# Patient Record
Sex: Female | Born: 1960 | Race: White | Hispanic: No | Marital: Married | Smoking: Never smoker
Health system: Southern US, Community
[De-identification: ages and names within clinical notes are randomized; demographics above are authoritative.]

## PROBLEM LIST (undated history)

## (undated) DIAGNOSIS — M5136 Other intervertebral disc degeneration, lumbar region: Secondary | ICD-10-CM

## (undated) DIAGNOSIS — M51369 Other intervertebral disc degeneration, lumbar region without mention of lumbar back pain or lower extremity pain: Secondary | ICD-10-CM

## (undated) DIAGNOSIS — G709 Myoneural disorder, unspecified: Secondary | ICD-10-CM

## (undated) DIAGNOSIS — M1990 Unspecified osteoarthritis, unspecified site: Secondary | ICD-10-CM

## (undated) HISTORY — DX: Unspecified osteoarthritis, unspecified site: M19.90

## (undated) HISTORY — DX: Myoneural disorder, unspecified: G70.9

## (undated) HISTORY — DX: Other intervertebral disc degeneration, lumbar region: M51.36

## (undated) HISTORY — DX: Other intervertebral disc degeneration, lumbar region without mention of lumbar back pain or lower extremity pain: M51.369

---

## 1993-10-22 HISTORY — PX: OTHER SURGICAL HISTORY: SHX169

## 2003-10-23 HISTORY — PX: INGUINAL HERNIA REPAIR: SHX194

## 2005-10-22 HISTORY — PX: HERNIA REPAIR: SHX51

## 2008-05-27 ENCOUNTER — Encounter: Admission: RE | Admit: 2008-05-27 | Discharge: 2008-05-27 | Payer: Self-pay | Admitting: Obstetrics and Gynecology

## 2009-07-04 ENCOUNTER — Encounter: Admission: RE | Admit: 2009-07-04 | Discharge: 2009-07-04 | Payer: Self-pay | Admitting: Obstetrics and Gynecology

## 2010-08-28 ENCOUNTER — Encounter: Admission: RE | Admit: 2010-08-28 | Discharge: 2010-08-28 | Payer: Self-pay | Admitting: Obstetrics and Gynecology

## 2010-11-13 ENCOUNTER — Encounter: Payer: Self-pay | Admitting: *Deleted

## 2010-11-13 ENCOUNTER — Encounter (HOSPITAL_COMMUNITY): Payer: Self-pay | Admitting: Obstetrics and Gynecology

## 2011-03-23 DIAGNOSIS — G709 Myoneural disorder, unspecified: Secondary | ICD-10-CM

## 2011-03-23 HISTORY — DX: Myoneural disorder, unspecified: G70.9

## 2011-06-21 ENCOUNTER — Encounter: Payer: Self-pay | Admitting: Internal Medicine

## 2011-07-25 ENCOUNTER — Other Ambulatory Visit: Payer: Self-pay | Admitting: Internal Medicine

## 2011-07-31 ENCOUNTER — Ambulatory Visit (AMBULATORY_SURGERY_CENTER): Payer: BC Managed Care – PPO | Admitting: *Deleted

## 2011-07-31 VITALS — Ht 64.0 in | Wt 126.0 lb

## 2011-07-31 DIAGNOSIS — R1032 Left lower quadrant pain: Secondary | ICD-10-CM

## 2011-07-31 DIAGNOSIS — Z1211 Encounter for screening for malignant neoplasm of colon: Secondary | ICD-10-CM

## 2011-07-31 MED ORDER — PEG-KCL-NACL-NASULF-NA ASC-C 100 G PO SOLR: ORAL | Status: DC

## 2011-08-09 ENCOUNTER — Telehealth: Payer: Self-pay | Admitting: Internal Medicine

## 2011-08-09 NOTE — Telephone Encounter (Signed)
I have reviewed the colon report from 08/2006- no polyps. Considering the F hx of colon polyps, she would be due a recall in 7010 years, so 08/2013 would be the earliest. If she wants it, however now  We can do it.

## 2011-08-09 NOTE — Telephone Encounter (Signed)
Left message on pt's phone and explained that her colon report from [facility] came and I put it on Dr. Regino Schultze desk.  I explained Dr. Juanda Chance was the hospital dr this week.  Dr. Juanda Chance, Ms Koskela had a colon in [place] and we have the report.  I put it on your desk to review.  She did not have any polyps.  She has family hx of adenomatous polyps first degree relative/Mother.  She c/o LLQ abd pain and she thought she needed a colon this year due to the family hx of polyps.  Please advise.  She is scheduled for 08/14/11.   Thank you.  Wyona Almas

## 2011-08-09 NOTE — Telephone Encounter (Signed)
Attempted to reach pt.  Message says phone has been disconnected. Penny Horton

## 2011-08-10 NOTE — Telephone Encounter (Signed)
Pt.  Notified that her colon would not be due until 2014 - 2017. She chose to wait.  Colon Cx'd.   Penny Horton

## 2011-08-13 ENCOUNTER — Telehealth: Payer: Self-pay | Admitting: *Deleted

## 2011-08-13 NOTE — Telephone Encounter (Signed)
Dr Juanda Chance has reviewed patient's colonoscopy report from [facility] from 08/27/2006 and states that she does not need recall colonoscopy until 08/2013 (she only has family history of colon adenoma). I have left a voicemail for patient to call back to advise her of this.

## 2011-08-13 NOTE — Telephone Encounter (Signed)
Patient has been advised that her colonoscopy is not due until 2014. She verbalizes understanding.

## 2011-08-14 ENCOUNTER — Other Ambulatory Visit: Payer: Self-pay | Admitting: Internal Medicine

## 2011-09-28 ENCOUNTER — Other Ambulatory Visit: Payer: Self-pay | Admitting: Obstetrics and Gynecology

## 2011-09-28 DIAGNOSIS — Z1231 Encounter for screening mammogram for malignant neoplasm of breast: Secondary | ICD-10-CM

## 2011-10-09 ENCOUNTER — Ambulatory Visit
Admission: RE | Admit: 2011-10-09 | Discharge: 2011-10-09 | Disposition: A | Discharge status: Home / Self Care | Payer: BC Managed Care – PPO | Source: Ambulatory Visit | Attending: Obstetrics and Gynecology | Admitting: Obstetrics and Gynecology

## 2011-10-09 DIAGNOSIS — Z1231 Encounter for screening mammogram for malignant neoplasm of breast: Secondary | ICD-10-CM

## 2012-07-22 ENCOUNTER — Other Ambulatory Visit: Payer: Self-pay | Admitting: Obstetrics and Gynecology

## 2012-07-22 DIAGNOSIS — Z1231 Encounter for screening mammogram for malignant neoplasm of breast: Secondary | ICD-10-CM

## 2012-10-09 ENCOUNTER — Ambulatory Visit
Admission: RE | Admit: 2012-10-09 | Discharge: 2012-10-09 | Disposition: A | Discharge status: Home / Self Care | Payer: BC Managed Care – PPO | Source: Ambulatory Visit | Attending: Obstetrics and Gynecology | Admitting: Obstetrics and Gynecology

## 2012-10-09 ENCOUNTER — Ambulatory Visit: Payer: BC Managed Care – PPO

## 2012-10-09 DIAGNOSIS — Z1231 Encounter for screening mammogram for malignant neoplasm of breast: Secondary | ICD-10-CM

## 2013-06-08 ENCOUNTER — Encounter: Payer: Self-pay | Admitting: Internal Medicine

## 2013-07-01 ENCOUNTER — Other Ambulatory Visit: Payer: Self-pay

## 2013-07-01 DIAGNOSIS — Z1231 Encounter for screening mammogram for malignant neoplasm of breast: Secondary | ICD-10-CM

## 2013-10-12 ENCOUNTER — Ambulatory Visit
Admission: RE | Admit: 2013-10-12 | Discharge: 2013-10-12 | Disposition: A | Discharge status: Home / Self Care | Payer: BC Managed Care – PPO | Source: Ambulatory Visit

## 2013-10-12 DIAGNOSIS — Z1231 Encounter for screening mammogram for malignant neoplasm of breast: Secondary | ICD-10-CM

## 2014-03-20 ENCOUNTER — Encounter: Payer: Self-pay | Admitting: Internal Medicine

## 2014-08-30 ENCOUNTER — Other Ambulatory Visit: Payer: Self-pay

## 2014-08-30 DIAGNOSIS — Z1231 Encounter for screening mammogram for malignant neoplasm of breast: Secondary | ICD-10-CM

## 2014-10-22 HISTORY — PX: COLONOSCOPY: SHX174

## 2014-10-27 ENCOUNTER — Ambulatory Visit: Admission: RE | Admit: 2014-10-27 | Payer: Self-pay | Source: Ambulatory Visit

## 2014-10-27 ENCOUNTER — Other Ambulatory Visit: Payer: Self-pay

## 2014-10-27 ENCOUNTER — Ambulatory Visit
Admission: RE | Admit: 2014-10-27 | Discharge: 2014-10-27 | Disposition: A | Discharge status: Home / Self Care | Payer: BLUE CROSS/BLUE SHIELD | Source: Ambulatory Visit

## 2014-10-27 ENCOUNTER — Ambulatory Visit: Payer: BC Managed Care – PPO

## 2014-10-27 DIAGNOSIS — Z1231 Encounter for screening mammogram for malignant neoplasm of breast: Secondary | ICD-10-CM

## 2015-10-10 ENCOUNTER — Other Ambulatory Visit: Payer: Self-pay

## 2015-10-10 DIAGNOSIS — Z1231 Encounter for screening mammogram for malignant neoplasm of breast: Secondary | ICD-10-CM

## 2015-11-08 ENCOUNTER — Ambulatory Visit
Admission: RE | Admit: 2015-11-08 | Discharge: 2015-11-08 | Disposition: A | Discharge status: Home / Self Care | Payer: BLUE CROSS/BLUE SHIELD | Source: Ambulatory Visit

## 2015-11-08 DIAGNOSIS — Z1231 Encounter for screening mammogram for malignant neoplasm of breast: Secondary | ICD-10-CM

## 2016-01-24 DIAGNOSIS — M545 Low back pain: Secondary | ICD-10-CM | POA: Diagnosis not present

## 2016-01-24 DIAGNOSIS — Z6821 Body mass index (BMI) 21.0-21.9, adult: Secondary | ICD-10-CM | POA: Diagnosis not present

## 2016-02-23 DIAGNOSIS — K529 Noninfective gastroenteritis and colitis, unspecified: Secondary | ICD-10-CM | POA: Diagnosis not present

## 2016-02-23 DIAGNOSIS — Z6821 Body mass index (BMI) 21.0-21.9, adult: Secondary | ICD-10-CM | POA: Diagnosis not present

## 2016-04-29 DIAGNOSIS — H15101 Unspecified episcleritis, right eye: Secondary | ICD-10-CM | POA: Diagnosis not present

## 2016-05-17 DIAGNOSIS — R5383 Other fatigue: Secondary | ICD-10-CM | POA: Diagnosis not present

## 2016-05-17 DIAGNOSIS — E049 Nontoxic goiter, unspecified: Secondary | ICD-10-CM | POA: Diagnosis not present

## 2016-05-17 DIAGNOSIS — M545 Low back pain: Secondary | ICD-10-CM | POA: Diagnosis not present

## 2016-05-17 DIAGNOSIS — M791 Myalgia: Secondary | ICD-10-CM | POA: Diagnosis not present

## 2016-05-17 DIAGNOSIS — M5137 Other intervertebral disc degeneration, lumbosacral region: Secondary | ICD-10-CM | POA: Diagnosis not present

## 2016-05-17 DIAGNOSIS — D6489 Other specified anemias: Secondary | ICD-10-CM | POA: Diagnosis not present

## 2016-05-18 DIAGNOSIS — M542 Cervicalgia: Secondary | ICD-10-CM | POA: Diagnosis not present

## 2016-05-22 DIAGNOSIS — E042 Nontoxic multinodular goiter: Secondary | ICD-10-CM | POA: Diagnosis not present

## 2016-05-23 DIAGNOSIS — M791 Myalgia: Secondary | ICD-10-CM | POA: Diagnosis not present

## 2016-05-28 DIAGNOSIS — E041 Nontoxic single thyroid nodule: Secondary | ICD-10-CM | POA: Diagnosis not present

## 2016-06-07 DIAGNOSIS — M5441 Lumbago with sciatica, right side: Secondary | ICD-10-CM | POA: Diagnosis not present

## 2016-06-19 DIAGNOSIS — L821 Other seborrheic keratosis: Secondary | ICD-10-CM | POA: Diagnosis not present

## 2016-06-28 DIAGNOSIS — M791 Myalgia: Secondary | ICD-10-CM | POA: Diagnosis not present

## 2016-07-26 DIAGNOSIS — K648 Other hemorrhoids: Secondary | ICD-10-CM | POA: Diagnosis not present

## 2016-07-26 DIAGNOSIS — R194 Change in bowel habit: Secondary | ICD-10-CM | POA: Diagnosis not present

## 2016-07-27 DIAGNOSIS — M4726 Other spondylosis with radiculopathy, lumbar region: Secondary | ICD-10-CM | POA: Diagnosis not present

## 2016-07-27 DIAGNOSIS — M5416 Radiculopathy, lumbar region: Secondary | ICD-10-CM | POA: Diagnosis not present

## 2016-07-27 DIAGNOSIS — Z0000 Encounter for general adult medical examination without abnormal findings: Secondary | ICD-10-CM | POA: Diagnosis not present

## 2016-07-27 DIAGNOSIS — M5136 Other intervertebral disc degeneration, lumbar region: Secondary | ICD-10-CM | POA: Diagnosis not present

## 2016-07-27 DIAGNOSIS — M48061 Spinal stenosis, lumbar region without neurogenic claudication: Secondary | ICD-10-CM | POA: Diagnosis not present

## 2016-07-31 DIAGNOSIS — H409 Unspecified glaucoma: Secondary | ICD-10-CM | POA: Diagnosis not present

## 2016-07-31 DIAGNOSIS — Z1389 Encounter for screening for other disorder: Secondary | ICD-10-CM | POA: Diagnosis not present

## 2016-07-31 DIAGNOSIS — Z0000 Encounter for general adult medical examination without abnormal findings: Secondary | ICD-10-CM | POA: Diagnosis not present

## 2016-07-31 DIAGNOSIS — M791 Myalgia: Secondary | ICD-10-CM | POA: Diagnosis not present

## 2016-07-31 DIAGNOSIS — M5136 Other intervertebral disc degeneration, lumbar region: Secondary | ICD-10-CM | POA: Diagnosis not present

## 2016-07-31 DIAGNOSIS — E049 Nontoxic goiter, unspecified: Secondary | ICD-10-CM | POA: Diagnosis not present

## 2016-08-02 DIAGNOSIS — Z1212 Encounter for screening for malignant neoplasm of rectum: Secondary | ICD-10-CM | POA: Diagnosis not present

## 2016-08-02 LAB — IFOBT (OCCULT BLOOD): IMMUNOLOGICAL FECAL OCCULT BLOOD TEST: NEGATIVE

## 2016-10-11 ENCOUNTER — Other Ambulatory Visit: Payer: Self-pay | Admitting: Obstetrics and Gynecology

## 2016-10-11 DIAGNOSIS — Z1231 Encounter for screening mammogram for malignant neoplasm of breast: Secondary | ICD-10-CM

## 2016-11-15 ENCOUNTER — Ambulatory Visit
Admission: RE | Admit: 2016-11-15 | Discharge: 2016-11-15 | Disposition: A | Discharge status: Home / Self Care | Payer: BLUE CROSS/BLUE SHIELD | Source: Ambulatory Visit | Attending: Obstetrics and Gynecology | Admitting: Obstetrics and Gynecology

## 2016-11-15 DIAGNOSIS — Z1231 Encounter for screening mammogram for malignant neoplasm of breast: Secondary | ICD-10-CM

## 2016-11-20 DIAGNOSIS — M5410 Radiculopathy, site unspecified: Secondary | ICD-10-CM | POA: Diagnosis not present

## 2016-11-20 DIAGNOSIS — M79605 Pain in left leg: Secondary | ICD-10-CM | POA: Diagnosis not present

## 2016-11-20 DIAGNOSIS — S76312A Strain of muscle, fascia and tendon of the posterior muscle group at thigh level, left thigh, initial encounter: Secondary | ICD-10-CM | POA: Diagnosis not present

## 2016-12-04 DIAGNOSIS — M79605 Pain in left leg: Secondary | ICD-10-CM | POA: Diagnosis not present

## 2016-12-27 ENCOUNTER — Encounter: Payer: Self-pay | Admitting: Family Medicine

## 2016-12-27 DIAGNOSIS — M5136 Other intervertebral disc degeneration, lumbar region: Secondary | ICD-10-CM | POA: Diagnosis not present

## 2016-12-27 DIAGNOSIS — M5441 Lumbago with sciatica, right side: Secondary | ICD-10-CM | POA: Diagnosis not present

## 2017-01-14 DIAGNOSIS — Z6821 Body mass index (BMI) 21.0-21.9, adult: Secondary | ICD-10-CM | POA: Diagnosis not present

## 2017-01-14 DIAGNOSIS — Z01419 Encounter for gynecological examination (general) (routine) without abnormal findings: Secondary | ICD-10-CM | POA: Diagnosis not present

## 2017-04-04 DIAGNOSIS — M5416 Radiculopathy, lumbar region: Secondary | ICD-10-CM | POA: Diagnosis not present

## 2017-04-04 DIAGNOSIS — M25552 Pain in left hip: Secondary | ICD-10-CM | POA: Diagnosis not present

## 2017-06-28 ENCOUNTER — Telehealth: Payer: Self-pay

## 2017-06-28 NOTE — Telephone Encounter (Signed)
Patient is scheduled to see Dr. Sarajane Jews next week, however her insurance co is saying they cannot find [place] in their network. There is a copy of her current insurance card in her media tab. She has [employer] [place].  How do we find out if we are in-network with her plan? Thanks!

## 2017-07-01 NOTE — Telephone Encounter (Signed)
Can you check for this pt on her insurance?  She says [employer] has Penny Horton listed , but no providers.

## 2017-07-02 ENCOUNTER — Ambulatory Visit (INDEPENDENT_AMBULATORY_CARE_PROVIDER_SITE_OTHER): Payer: BLUE CROSS/BLUE SHIELD | Admitting: Family Medicine

## 2017-07-02 ENCOUNTER — Encounter: Payer: Self-pay | Admitting: Family Medicine

## 2017-07-02 VITALS — BP 96/79 | HR 52 | Temp 97.8°F | Ht 64.0 in | Wt 132.0 lb

## 2017-07-02 DIAGNOSIS — M791 Myalgia, unspecified site: Secondary | ICD-10-CM

## 2017-07-02 DIAGNOSIS — H409 Unspecified glaucoma: Secondary | ICD-10-CM | POA: Insufficient documentation

## 2017-07-02 DIAGNOSIS — M150 Primary generalized (osteo)arthritis: Secondary | ICD-10-CM | POA: Diagnosis not present

## 2017-07-02 DIAGNOSIS — H4010X0 Unspecified open-angle glaucoma, stage unspecified: Secondary | ICD-10-CM

## 2017-07-02 DIAGNOSIS — M1990 Unspecified osteoarthritis, unspecified site: Secondary | ICD-10-CM | POA: Insufficient documentation

## 2017-07-02 DIAGNOSIS — M159 Polyosteoarthritis, unspecified: Secondary | ICD-10-CM

## 2017-07-02 DIAGNOSIS — M8949 Other hypertrophic osteoarthropathy, multiple sites: Secondary | ICD-10-CM

## 2017-07-02 NOTE — Progress Notes (Signed)
   Subjective:    Patient ID: Penny Horton, female    DOB: July 13, 1961, 56 y.o.   MRN: [id number]  HPI 56 yr old female to establish with [place] after transferring from Dr. Domenick Gong. She feels well except for arthritis pain in numerous joints, especially the hips. She describes developing diffuse muscle aches, especially in both thighs, last year. She initially had an elevated CK up to the 900s, and then this decreased to the 500s on follow up. For some reason this has not been checked in over a year. She still has some mild aches in the thighs but never takes anything for this. She has osteoarthritis in numerous joints and degenerative discs in the spine. She also notes that she had a mildly elevated ferritin level a year ago, and she is concerned about having hemochromatosis because several family members have been diagnosed with this. She gets yearly pelvic exams and mammograms. She has glaucoma which is well controlled. 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    Review of Systems  Constitutional: Negative.   Respiratory: Negative.   Cardiovascular: Negative.   Gastrointestinal: Negative.   Genitourinary: Negative.   Musculoskeletal: Positive for arthralgias, back pain and myalgias.  Neurological: Negative.        Objective:   Physical Exam  Constitutional: She is oriented to person, place, and time. She appears well-developed and well-nourished.  Neck: No thyromegaly present.  Cardiovascular: Normal rate, regular rhythm, normal heart sounds and intact distal pulses.   Pulmonary/Chest: Effort normal and breath sounds normal. No respiratory distress. She has no wheezes. She has no rales.  Abdominal: Soft. Bowel sounds are normal. She exhibits no distension and no mass. There is no tenderness. There is no rebound and no guarding.  No HSM   Lymphadenopathy:    She has no cervical adenopathy.  Neurological: She is alert and oriented to person, place,  and time.          Assessment & Plan:  Introductory visit with this patient who has osteoarthritis and what sounds like an inflammatory muscle condition like polymyositis. We will check a CK level today, and we will check markers like an ESR and CRP. We will also screen for basic labs including a ferritin and a serum iron to rule out hemochromatosis.  Alysia Penna, MD

## 2017-07-02 NOTE — Patient Instructions (Signed)
WE NOW OFFER   [facility] FAST TRACK!!!  SAME DAY Appointments for ACUTE CARE  Such as: Sprains, Injuries, cuts, abrasions, rashes, muscle pain, joint pain, back pain Colds, flu, sore throats, headache, allergies, cough, fever  Ear pain, sinus and eye infections Abdominal pain, nausea, vomiting, diarrhea, upset stomach Animal/insect bites  3 Easy Ways to Schedule: Walk-In Scheduling Call in scheduling Mychart Sign-up: [email or web address]         

## 2017-07-03 LAB — BASIC METABOLIC PANEL
BUN: 16 mg/dL (ref 6–23)
CALCIUM: 9.5 mg/dL (ref 8.4–10.5)
CHLORIDE: 103 meq/L (ref 96–112)
CO2: 30 meq/L (ref 19–32)
Creatinine, Ser: 0.85 mg/dL (ref 0.40–1.20)
GFR: 73.51 mL/min (ref >60.00)
GLUCOSE: 84 mg/dL (ref 70–99)
POTASSIUM: 4 meq/L (ref 3.5–5.1)
SODIUM: 142 meq/L (ref 135–145)

## 2017-07-03 LAB — CBC WITH DIFFERENTIAL/PLATELET
BASOS PCT: 0.4 % (ref 0.0–3.0)
Basophils Absolute: 0 10*3/uL (ref 0.0–0.1)
EOS ABS: 0.1 10*3/uL (ref 0.0–0.7)
Eosinophils Relative: 1.4 % (ref 0.0–5.0)
HEMATOCRIT: 40.1 % (ref 36.0–46.0)
Hemoglobin: 13.4 g/dL (ref 12.0–15.0)
LYMPHS ABS: 1.1 10*3/uL (ref 0.7–4.0)
LYMPHS PCT: 21.1 % (ref 12.0–46.0)
MCHC: 33.5 g/dL (ref 30.0–36.0)
MCV: 96.4 fl (ref 78.0–100.0)
Monocytes Absolute: 0.5 10*3/uL (ref 0.1–1.0)
Monocytes Relative: 9.2 % (ref 3.0–12.0)
NEUTROS ABS: 3.5 10*3/uL (ref 1.4–7.7)
NEUTROS PCT: 67.9 % (ref 43.0–77.0)
PLATELETS: 220 10*3/uL (ref 150.0–400.0)
RBC: 4.15 Mil/uL (ref 3.87–5.11)
RDW: 12.3 % (ref 11.5–15.5)
WBC: 5.1 10*3/uL (ref 4.0–10.5)

## 2017-07-03 LAB — IRON: Iron: 99 ug/dL (ref 42–145)

## 2017-07-03 LAB — HEPATIC FUNCTION PANEL
ALBUMIN: 4.4 g/dL (ref 3.5–5.2)
ALK PHOS: 91 U/L (ref 39–117)
ALT: 14 U/L (ref 0–35)
AST: 20 U/L (ref 0–37)
BILIRUBIN DIRECT: 0.1 mg/dL (ref 0.0–0.3)
TOTAL PROTEIN: 6.4 g/dL (ref 6.0–8.3)
Total Bilirubin: 0.3 mg/dL (ref 0.2–1.2)

## 2017-07-03 LAB — FERRITIN: Ferritin: 44.7 ng/mL (ref 10.0–291.0)

## 2017-07-03 LAB — C-REACTIVE PROTEIN: CRP: <0 mg/dL — ABNORMAL LOW (ref 0.5–20.0)

## 2017-07-03 LAB — TSH: TSH: 1.22 u[IU]/mL (ref 0.35–4.50)

## 2017-07-03 LAB — CK: Total CK: 129 U/L (ref 7–177)

## 2017-07-03 LAB — SEDIMENTATION RATE: SED RATE: 1 mm/h (ref 0–30)

## 2017-09-23 DIAGNOSIS — H401221 Low-tension glaucoma, left eye, mild stage: Secondary | ICD-10-CM | POA: Diagnosis not present

## 2017-09-23 DIAGNOSIS — H0014 Chalazion left upper eyelid: Secondary | ICD-10-CM | POA: Diagnosis not present

## 2017-09-23 DIAGNOSIS — H25032 Anterior subcapsular polar age-related cataract, left eye: Secondary | ICD-10-CM | POA: Diagnosis not present

## 2017-09-26 DIAGNOSIS — H00021 Hordeolum internum right upper eyelid: Secondary | ICD-10-CM | POA: Diagnosis not present

## 2017-09-26 DIAGNOSIS — H0015 Chalazion left lower eyelid: Secondary | ICD-10-CM | POA: Diagnosis not present

## 2017-09-26 DIAGNOSIS — H02886 Meibomian gland dysfunction of left eye, unspecified eyelid: Secondary | ICD-10-CM | POA: Diagnosis not present

## 2017-09-26 DIAGNOSIS — H00011 Hordeolum externum right upper eyelid: Secondary | ICD-10-CM | POA: Diagnosis not present

## 2017-09-26 DIAGNOSIS — H00014 Hordeolum externum left upper eyelid: Secondary | ICD-10-CM | POA: Diagnosis not present

## 2017-09-26 DIAGNOSIS — R112 Nausea with vomiting, unspecified: Secondary | ICD-10-CM | POA: Diagnosis not present

## 2017-09-26 DIAGNOSIS — H02883 Meibomian gland dysfunction of right eye, unspecified eyelid: Secondary | ICD-10-CM | POA: Diagnosis not present

## 2017-09-26 DIAGNOSIS — H0011 Chalazion right upper eyelid: Secondary | ICD-10-CM | POA: Diagnosis not present

## 2017-09-26 DIAGNOSIS — R51 Headache: Secondary | ICD-10-CM | POA: Diagnosis not present

## 2017-09-26 DIAGNOSIS — H02831 Dermatochalasis of right upper eyelid: Secondary | ICD-10-CM | POA: Diagnosis not present

## 2017-09-26 DIAGNOSIS — H00015 Hordeolum externum left lower eyelid: Secondary | ICD-10-CM | POA: Diagnosis not present

## 2017-09-26 DIAGNOSIS — H00024 Hordeolum internum left upper eyelid: Secondary | ICD-10-CM | POA: Diagnosis not present

## 2017-09-26 DIAGNOSIS — L03213 Periorbital cellulitis: Secondary | ICD-10-CM | POA: Diagnosis not present

## 2017-09-26 DIAGNOSIS — H25032 Anterior subcapsular polar age-related cataract, left eye: Secondary | ICD-10-CM | POA: Diagnosis not present

## 2017-09-26 DIAGNOSIS — H4089 Other specified glaucoma: Secondary | ICD-10-CM | POA: Diagnosis not present

## 2017-09-26 DIAGNOSIS — H02834 Dermatochalasis of left upper eyelid: Secondary | ICD-10-CM | POA: Diagnosis not present

## 2017-10-10 DIAGNOSIS — H00011 Hordeolum externum right upper eyelid: Secondary | ICD-10-CM | POA: Diagnosis not present

## 2017-10-10 DIAGNOSIS — H00022 Hordeolum internum right lower eyelid: Secondary | ICD-10-CM | POA: Diagnosis not present

## 2017-10-10 DIAGNOSIS — H00015 Hordeolum externum left lower eyelid: Secondary | ICD-10-CM | POA: Diagnosis not present

## 2017-10-10 DIAGNOSIS — H00014 Hordeolum externum left upper eyelid: Secondary | ICD-10-CM | POA: Diagnosis not present

## 2017-10-28 ENCOUNTER — Other Ambulatory Visit: Payer: Self-pay | Admitting: Obstetrics and Gynecology

## 2017-10-28 DIAGNOSIS — Z1231 Encounter for screening mammogram for malignant neoplasm of breast: Secondary | ICD-10-CM

## 2017-11-19 ENCOUNTER — Ambulatory Visit
Admission: RE | Admit: 2017-11-19 | Discharge: 2017-11-19 | Disposition: A | Discharge status: Home / Self Care | Payer: BLUE CROSS/BLUE SHIELD | Source: Ambulatory Visit | Attending: Obstetrics and Gynecology | Admitting: Obstetrics and Gynecology

## 2017-11-19 DIAGNOSIS — Z1231 Encounter for screening mammogram for malignant neoplasm of breast: Secondary | ICD-10-CM

## 2018-02-11 DIAGNOSIS — Z6822 Body mass index (BMI) 22.0-22.9, adult: Secondary | ICD-10-CM | POA: Diagnosis not present

## 2018-02-11 DIAGNOSIS — Z01419 Encounter for gynecological examination (general) (routine) without abnormal findings: Secondary | ICD-10-CM | POA: Diagnosis not present

## 2018-02-18 DIAGNOSIS — R222 Localized swelling, mass and lump, trunk: Secondary | ICD-10-CM | POA: Diagnosis not present

## 2018-03-04 ENCOUNTER — Encounter: Payer: Self-pay | Admitting: Family Medicine

## 2018-03-04 ENCOUNTER — Ambulatory Visit (INDEPENDENT_AMBULATORY_CARE_PROVIDER_SITE_OTHER): Payer: BLUE CROSS/BLUE SHIELD | Admitting: Family Medicine

## 2018-03-04 VITALS — BP 94/62 | HR 49 | Temp 98.4°F | Wt 131.4 lb

## 2018-03-04 DIAGNOSIS — M2550 Pain in unspecified joint: Secondary | ICD-10-CM | POA: Diagnosis not present

## 2018-03-04 NOTE — Progress Notes (Signed)
   Subjective:    Patient ID: Penny Horton, female    DOB: 12-24-1960, 57 y.o.   MRN: [id number]  HPI Here for intermittent stiffness and pain in the elbows and hands. She saw [place] last fall for similar joint pains and we drew some labs that were all normal. These pains come and go and they move from one area to another. She takes turmeric daily and she tries to avoid taking NSAIDs if she can.    Review of Systems  Constitutional: Negative.   Respiratory: Negative.   Cardiovascular: Negative.   Musculoskeletal: Positive for arthralgias. Negative for joint swelling.       Objective:   Physical Exam  Constitutional: She appears well-developed and well-nourished.  Cardiovascular: Normal rate, regular rhythm, normal heart sounds and intact distal pulses.  Pulmonary/Chest: Effort normal and breath sounds normal.  Musculoskeletal: Normal range of motion. She exhibits no edema or tenderness.          Assessment & Plan:  Myalgias and arthralgias of uncertain etiology. We will refer her to Rheumatology to evaluate.  Alysia Penna, MD

## 2018-07-14 ENCOUNTER — Encounter: Payer: Self-pay | Admitting: Family Medicine

## 2018-07-14 ENCOUNTER — Ambulatory Visit (INDEPENDENT_AMBULATORY_CARE_PROVIDER_SITE_OTHER): Payer: BLUE CROSS/BLUE SHIELD | Admitting: Family Medicine

## 2018-07-14 VITALS — BP 100/78 | HR 67 | Temp 98.3°F | Wt 128.0 lb

## 2018-07-14 DIAGNOSIS — M545 Low back pain, unspecified: Secondary | ICD-10-CM

## 2018-07-14 MED ORDER — METHYLPREDNISOLONE 4 MG PO TBPK: ORAL_TABLET | ORAL | 0 refills | Status: DC

## 2018-07-14 MED ORDER — METAXALONE 800 MG PO TABS: 800.0000 mg | ORAL_TABLET | Freq: Four times a day (QID) | ORAL | 2 refills | Status: DC | PRN

## 2018-07-14 NOTE — Progress Notes (Signed)
   Subjective:    Patient ID: Penny Horton, female    DOB: 1961-01-01, 57 y.o.   MRN: [id number]  HPI Here for 3 weeks of constant low back pain and stiffness. The pain does not radiate to the legs. No hx of recent trauma, but the pain started shortly after a trip to [place] which involved 2 long plane flights. She saw Dr. Ellene Route 7 years ago for low back pain and an MRI revealed a bulging disc at L5-S1. She was treated at that time with an epidural steroid injection and PT. She responded well. Then one year ago she had another flare of pain and she was given another ESI. This also worked well. Over the past few weeks she has used ice packs and ibuprofen with mixed results. She is still working every day.    Review of Systems  Constitutional: Negative.   Respiratory: Negative.   Cardiovascular: Negative.   Gastrointestinal: Negative.   Genitourinary: Negative.   Musculoskeletal: Positive for back pain.       Objective:   Physical Exam  Constitutional: She appears well-developed and well-nourished.  Cardiovascular: Normal rate, regular rhythm, normal heart sounds and intact distal pulses.  Pulmonary/Chest: Effort normal and breath sounds normal.  Musculoskeletal:  She is mildly tender in the lower back and there is some spasm present. ROM is full. SLR are negative.           Assessment & Plan:  Low back pain, possibly caused by a long plane flight. Try heat, a Medrol dose pack, and Skelaxin. Recheck prn. Alysia Penna, MD

## 2018-08-29 ENCOUNTER — Telehealth: Payer: Self-pay | Admitting: *Deleted

## 2018-08-29 NOTE — Telephone Encounter (Signed)
Dr Fry please advise. thanks 

## 2018-08-29 NOTE — Telephone Encounter (Signed)
Copied from [place] [phone]. Topic: General - Other >> Aug 29, 2018 12:16 PM Yvette Rack wrote: Reason for CRM: pt calling stating that she would drop off a report about her back for Dr Sarajane Jews can look at it after work   Will send to Dr Sarajane Jews nurse to keep an eye out for forms.

## 2018-09-05 ENCOUNTER — Ambulatory Visit (INDEPENDENT_AMBULATORY_CARE_PROVIDER_SITE_OTHER): Payer: BLUE CROSS/BLUE SHIELD | Admitting: Family Medicine

## 2018-09-05 ENCOUNTER — Encounter: Payer: Self-pay | Admitting: Family Medicine

## 2018-09-05 VITALS — BP 120/70 | HR 61 | Temp 97.8°F | Ht 64.75 in | Wt 127.2 lb

## 2018-09-05 DIAGNOSIS — Z1331 Encounter for screening for depression: Secondary | ICD-10-CM

## 2018-09-05 DIAGNOSIS — M545 Low back pain, unspecified: Secondary | ICD-10-CM

## 2018-09-05 DIAGNOSIS — Z0000 Encounter for general adult medical examination without abnormal findings: Secondary | ICD-10-CM | POA: Diagnosis not present

## 2018-09-05 LAB — CBC WITH DIFFERENTIAL/PLATELET
BASOS ABS: 0 10*3/uL (ref 0.0–0.1)
BASOS PCT: 0.7 % (ref 0.0–3.0)
EOS ABS: 0.1 10*3/uL (ref 0.0–0.7)
Eosinophils Relative: 2.5 % (ref 0.0–5.0)
HEMATOCRIT: 41.5 % (ref 36.0–46.0)
HEMOGLOBIN: 14.1 g/dL (ref 12.0–15.0)
LYMPHS PCT: 21 % (ref 12.0–46.0)
Lymphs Abs: 1 10*3/uL (ref 0.7–4.0)
MCHC: 34 g/dL (ref 30.0–36.0)
MCV: 93.8 fl (ref 78.0–100.0)
Monocytes Absolute: 0.5 10*3/uL (ref 0.1–1.0)
Monocytes Relative: 9.8 % (ref 3.0–12.0)
NEUTROS PCT: 66 % (ref 43.0–77.0)
Neutro Abs: 3 10*3/uL (ref 1.4–7.7)
PLATELETS: 226 10*3/uL (ref 150.0–400.0)
RBC: 4.43 Mil/uL (ref 3.87–5.11)
RDW: 12.3 % (ref 11.5–15.5)
WBC: 4.6 10*3/uL (ref 4.0–10.5)

## 2018-09-05 LAB — LIPID PANEL
CHOL/HDL RATIO: 2
CHOLESTEROL: 212 mg/dL — AB (ref 0–200)
HDL: 111.8 mg/dL (ref >39.00)
LDL Cholesterol: 92 mg/dL (ref 0–99)
NonHDL: 100.2
Triglycerides: 42 mg/dL (ref 0.0–149.0)
VLDL: 8.4 mg/dL (ref 0.0–40.0)

## 2018-09-05 LAB — POC URINALSYSI DIPSTICK (AUTOMATED)
BILIRUBIN UA: NEGATIVE
Glucose, UA: NEGATIVE
KETONES UA: NEGATIVE
Leukocytes, UA: NEGATIVE
Nitrite, UA: NEGATIVE
Protein, UA: NEGATIVE
RBC UA: NEGATIVE
Urobilinogen, UA: 0.2 E.U./dL
pH, UA: 6.5 (ref 5.0–8.0)

## 2018-09-05 LAB — HEPATIC FUNCTION PANEL
ALT: 20 U/L (ref 0–35)
AST: 29 U/L (ref 0–37)
Albumin: 4.8 g/dL (ref 3.5–5.2)
Alkaline Phosphatase: 88 U/L (ref 39–117)
BILIRUBIN DIRECT: 0.1 mg/dL (ref 0.0–0.3)
BILIRUBIN TOTAL: 0.6 mg/dL (ref 0.2–1.2)
TOTAL PROTEIN: 6.9 g/dL (ref 6.0–8.3)

## 2018-09-05 LAB — TSH: TSH: 1.54 u[IU]/mL (ref 0.35–4.50)

## 2018-09-05 LAB — BASIC METABOLIC PANEL
BUN: 12 mg/dL (ref 6–23)
CALCIUM: 9.9 mg/dL (ref 8.4–10.5)
CO2: 33 meq/L — AB (ref 19–32)
Chloride: 102 mEq/L (ref 96–112)
Creatinine, Ser: 0.85 mg/dL (ref 0.40–1.20)
GFR: 73.2 mL/min (ref >60.00)
Glucose, Bld: 93 mg/dL (ref 70–99)
Potassium: 4.4 mEq/L (ref 3.5–5.1)
Sodium: 141 mEq/L (ref 135–145)

## 2018-09-05 MED ORDER — MELOXICAM 15 MG PO TABS: 15.0000 mg | ORAL_TABLET | Freq: Every day | ORAL | 5 refills | Status: DC

## 2018-09-05 NOTE — Progress Notes (Signed)
   Subjective:    Patient ID: Penny Horton, female    DOB: 1961/03/20, 57 y.o.   MRN: [id number]  HPI Here for a well exam. She still has some joint pains in the elbows and hips and knees, and she still has the low back pain. At our last visit we gave her a Medrol dose pack and Metaxolone but these did not help at all. She has an appt with [facility] next month.    Review of Systems  Constitutional: Negative.   HENT: Negative.   Eyes: Negative.   Respiratory: Negative.   Cardiovascular: Negative.   Gastrointestinal: Negative.   Genitourinary: Negative for decreased urine volume, difficulty urinating, dyspareunia, dysuria, enuresis, flank pain, frequency, hematuria, pelvic pain and urgency.  Musculoskeletal: Positive for arthralgias and back pain.  Skin: Negative.   Neurological: Negative.   Psychiatric/Behavioral: Negative.        Objective:   Physical Exam  Constitutional: She is oriented to person, place, and time. She appears well-developed and well-nourished. No distress.  HENT:  Head: Normocephalic and atraumatic.  Right Ear: External ear normal.  Left Ear: External ear normal.  Nose: Nose normal.  Mouth/Throat: Oropharynx is clear and moist. No oropharyngeal exudate.  Eyes: Pupils are equal, round, and reactive to light. Conjunctivae and EOM are normal. No scleral icterus.  Neck: Normal range of motion. Neck supple. No JVD present. No thyromegaly present.  Cardiovascular: Normal rate, regular rhythm, normal heart sounds and intact distal pulses. Exam reveals no gallop and no friction rub.  No murmur heard. Pulmonary/Chest: Effort normal and breath sounds normal. No respiratory distress. She has no wheezes. She has no rales. She exhibits no tenderness.  Abdominal: Soft. Bowel sounds are normal. She exhibits no distension and no mass. There is no tenderness. There is no rebound and no guarding.  Musculoskeletal: Normal range of motion. She exhibits no  edema or tenderness.  Lymphadenopathy:    She has no cervical adenopathy.  Neurological: She is alert and oriented to person, place, and time. She has normal reflexes. She displays normal reflexes. No cranial nerve deficit. She exhibits normal muscle tone. Coordination normal.  Skin: Skin is warm and dry. No rash noted. No erythema.  Psychiatric: She has a normal mood and affect. Her behavior is normal. Judgment and thought content normal.          Assessment & Plan:  Well exam. We discussed diet and exercise. Get fasting labs. For the low back pain she will try Meloxicam daily and we will refer to [facility].  Alysia Penna, MD

## 2018-09-09 ENCOUNTER — Encounter: Payer: Self-pay | Admitting: *Deleted

## 2018-10-02 DIAGNOSIS — M2550 Pain in unspecified joint: Secondary | ICD-10-CM | POA: Diagnosis not present

## 2018-11-19 ENCOUNTER — Other Ambulatory Visit: Payer: Self-pay | Admitting: Obstetrics and Gynecology

## 2018-11-19 DIAGNOSIS — Z1231 Encounter for screening mammogram for malignant neoplasm of breast: Secondary | ICD-10-CM

## 2018-12-18 ENCOUNTER — Ambulatory Visit
Admission: RE | Admit: 2018-12-18 | Discharge: 2018-12-18 | Disposition: A | Discharge status: Home / Self Care | Payer: PRIVATE HEALTH INSURANCE | Source: Ambulatory Visit | Attending: Obstetrics and Gynecology | Admitting: Obstetrics and Gynecology

## 2018-12-18 DIAGNOSIS — Z1231 Encounter for screening mammogram for malignant neoplasm of breast: Secondary | ICD-10-CM

## 2019-01-19 ENCOUNTER — Encounter: Payer: Self-pay | Admitting: Family Medicine

## 2019-01-19 ENCOUNTER — Ambulatory Visit (INDEPENDENT_AMBULATORY_CARE_PROVIDER_SITE_OTHER): Payer: PRIVATE HEALTH INSURANCE | Admitting: Family Medicine

## 2019-01-19 ENCOUNTER — Other Ambulatory Visit: Payer: Self-pay

## 2019-01-19 DIAGNOSIS — J3089 Other allergic rhinitis: Secondary | ICD-10-CM

## 2019-01-19 NOTE — Progress Notes (Signed)
   Subjective:    Patient ID: Penny Horton, female    DOB: [birth year], 57 y.o.   MRN: [id number]  HPI Virtual Visit via Telephone Note  I connected with the patient on 01/19/19 at  1:15 PM EDT by Webex and verified that I am speaking with the correct person using two identifiers. We had technical difficulty with the video portion so we spoke verbally only.    I discussed the limitations, risks, security and privacy concerns of performing an evaluation and management service by telephone and the availability of in person appointments. I also discussed with the patient that there may be a patient responsible charge related to this service. The patient expressed understanding and agreed to proceed.  Location patient: home Location provider: work or home office Participants present for the call: patient, provider Patient did not have a visit in the prior 7 days to address this/these issue(s).   History of Present Illness: For the past 3 weeks she has had mild headaches and mild earaches off and on. No fever or PND or ST or cough. Then last week she began to have pain in a tooth that has been bothering her. She saw her oral surgeon last Thursday and he removed the tooth. He has her taking Amoxicillin for 10 days. The dental pain is now gone but the earache and headache persist.    Observations/Objective: Patient sounds cheerful and well on the phone. I do not appreciate any SOB. Speech and thought processing are grossly intact. Patient reported vitals:  Assessment and Plan: This sounds like she has congestion from allergies with some eustachian tube dysfunction. I suggested she try Mucinex 1200 mg bid. Recheck prn.  Alysia Penna, MD   Follow Up Instructions:     [phone] 11-20 [phone] 21-30 I did not refer this patient for an OV in the next 24 hours for this/these issue(s).  I discussed the assessment and treatment plan with the patient. The patient was provided an  opportunity to ask questions and all were answered. The patient agreed with the plan and demonstrated an understanding of the instructions.   The patient was advised to call back or seek an in-person evaluation if the symptoms worsen or if the condition fails to improve as anticipated.  I provided 12 minutes of non-face-to-face time during this encounter.   Alysia Penna, MD    Review of Systems     Objective:   Physical Exam        Assessment & Plan:

## 2020-02-02 ENCOUNTER — Other Ambulatory Visit: Payer: Self-pay | Admitting: Obstetrics and Gynecology

## 2020-02-02 DIAGNOSIS — Z1231 Encounter for screening mammogram for malignant neoplasm of breast: Secondary | ICD-10-CM

## 2020-02-03 ENCOUNTER — Encounter: Payer: Self-pay | Admitting: Family Medicine

## 2020-02-03 ENCOUNTER — Ambulatory Visit
Admission: RE | Admit: 2020-02-03 | Discharge: 2020-02-03 | Disposition: A | Discharge status: Home / Self Care | Payer: 59 | Source: Ambulatory Visit | Attending: Obstetrics and Gynecology | Admitting: Obstetrics and Gynecology

## 2020-02-03 ENCOUNTER — Other Ambulatory Visit: Payer: Self-pay

## 2020-02-03 ENCOUNTER — Ambulatory Visit: Payer: PRIVATE HEALTH INSURANCE

## 2020-02-03 ENCOUNTER — Ambulatory Visit: Payer: 59 | Admitting: Family Medicine

## 2020-02-03 VITALS — BP 118/62 | HR 56 | Temp 97.9°F | Wt 132.2 lb

## 2020-02-03 DIAGNOSIS — R42 Dizziness and giddiness: Secondary | ICD-10-CM

## 2020-02-03 DIAGNOSIS — Z1231 Encounter for screening mammogram for malignant neoplasm of breast: Secondary | ICD-10-CM | POA: Diagnosis not present

## 2020-02-03 NOTE — Progress Notes (Signed)
   Subjective:    Patient ID: Penny Horton, female    DOB: 1961/04/24, 59 y.o.   MRN: [id number]  HPI Here for the onset 2 weeks go of occasional dizzy spells. She has never had these before. No recent head trauma. No headaches or blurred vision. No other neurologic deficits. She describes these spells as feeling like the room is spinning around her. They last anywhere from 2-5 minutes and then they go away. They often seem to be triggered by her drinking a lot of water from a bottlle, so it seems tilting her head back may be the trigger. This does not happen when she is exercising or walking, nor does it happen in bed. No recent swimming or plane flights. No sinus congestion or allergy problems.   Review of Systems  Constitutional: Negative.   Respiratory: Negative.   Cardiovascular: Negative.   Neurological: Positive for dizziness. Negative for tremors, seizures, syncope, facial asymmetry, speech difficulty, weakness, light-headedness, numbness and headaches.       Objective:   Physical Exam Constitutional:      Appearance: Normal appearance. She is not ill-appearing.  Cardiovascular:     Rate and Rhythm: Normal rate and regular rhythm.     Pulses: Normal pulses.     Heart sounds: Normal heart sounds.  Pulmonary:     Effort: Pulmonary effort is normal.     Breath sounds: Normal breath sounds.  Musculoskeletal:     Cervical back: Normal range of motion.     Right lower leg: No edema.     Left lower leg: No edema.  Lymphadenopathy:     Cervical: No cervical adenopathy.  Neurological:     General: No focal deficit present.     Mental Status: She is alert and oriented to person, place, and time.     Cranial Nerves: No cranial nerve deficit.     Sensory: No sensory deficit.     Motor: No weakness.     Coordination: Coordination normal.     Gait: Gait normal.     Deep Tendon Reflexes: Reflexes normal.     Comments: I attempted to recreate her dizzy symptoms with  Dix-Hallpike manuevers but was not successful             Assessment & Plan:  This is consistent with mild vertigo. We agreed that no medication is needed now. I informed her that this is usually self limited, and she will monitor this closely. Recheck prn. Alysia Penna, MD

## 2020-03-29 DIAGNOSIS — M7502 Adhesive capsulitis of left shoulder: Secondary | ICD-10-CM | POA: Diagnosis not present

## 2020-03-29 DIAGNOSIS — M7582 Other shoulder lesions, left shoulder: Secondary | ICD-10-CM | POA: Diagnosis not present

## 2020-04-04 DIAGNOSIS — M7502 Adhesive capsulitis of left shoulder: Secondary | ICD-10-CM | POA: Diagnosis not present

## 2020-04-04 DIAGNOSIS — M25612 Stiffness of left shoulder, not elsewhere classified: Secondary | ICD-10-CM | POA: Diagnosis not present

## 2020-04-04 DIAGNOSIS — M25512 Pain in left shoulder: Secondary | ICD-10-CM | POA: Diagnosis not present

## 2020-04-13 DIAGNOSIS — Z6822 Body mass index (BMI) 22.0-22.9, adult: Secondary | ICD-10-CM | POA: Diagnosis not present

## 2020-04-13 DIAGNOSIS — H401232 Low-tension glaucoma, bilateral, moderate stage: Secondary | ICD-10-CM | POA: Diagnosis not present

## 2020-04-13 DIAGNOSIS — N952 Postmenopausal atrophic vaginitis: Secondary | ICD-10-CM | POA: Diagnosis not present

## 2020-04-13 DIAGNOSIS — Z01419 Encounter for gynecological examination (general) (routine) without abnormal findings: Secondary | ICD-10-CM | POA: Diagnosis not present

## 2020-05-04 DIAGNOSIS — H401232 Low-tension glaucoma, bilateral, moderate stage: Secondary | ICD-10-CM | POA: Diagnosis not present

## 2020-05-05 DIAGNOSIS — Z13820 Encounter for screening for osteoporosis: Secondary | ICD-10-CM | POA: Diagnosis not present

## 2020-05-25 DIAGNOSIS — M8580 Other specified disorders of bone density and structure, unspecified site: Secondary | ICD-10-CM | POA: Diagnosis not present

## 2020-06-01 ENCOUNTER — Other Ambulatory Visit: Payer: Self-pay

## 2020-06-01 ENCOUNTER — Ambulatory Visit: Payer: 59 | Admitting: Family Medicine

## 2020-06-01 ENCOUNTER — Ambulatory Visit (INDEPENDENT_AMBULATORY_CARE_PROVIDER_SITE_OTHER): Payer: 59 | Admitting: Family Medicine

## 2020-06-01 ENCOUNTER — Encounter: Payer: Self-pay | Admitting: Family Medicine

## 2020-06-01 VITALS — BP 122/62 | HR 56 | Temp 97.9°F | Ht 64.75 in | Wt 132.8 lb

## 2020-06-01 DIAGNOSIS — R42 Dizziness and giddiness: Secondary | ICD-10-CM

## 2020-06-01 DIAGNOSIS — H9193 Unspecified hearing loss, bilateral: Secondary | ICD-10-CM

## 2020-06-01 DIAGNOSIS — Z0000 Encounter for general adult medical examination without abnormal findings: Secondary | ICD-10-CM

## 2020-06-01 NOTE — Progress Notes (Signed)
Subjective:    Patient ID: Penny Horton, female    DOB: [birth year], 59 y.o.   MRN: [id number]  HPI Here for a well exam. She has a few issues to discuss. First she still has occasional spells of dizziness and loss of balance. No falling. She saw [place] in April for this and they persist. Now she also thinks she may have some hearing loss. She thinks this is symmetrical. No ear pain or tinnitus. She recently had a GYN exam and a bone density test. She was started on vitamin D. Lastly 3 days ago while working in her yard she was stung 4 times by bees. No systemic reactions, just local swelling.    Review of Systems  Constitutional: Negative.   HENT: Positive for hearing loss.   Eyes: Negative.   Respiratory: Negative.   Cardiovascular: Negative.   Gastrointestinal: Negative.   Genitourinary: Negative.  Negative for decreased urine volume, dysuria, enuresis, flank pain, frequency, hematuria, pelvic pain and urgency.  Musculoskeletal: Negative.   Skin: Negative.   Neurological: Positive for dizziness.  Psychiatric/Behavioral: Negative.        Objective:   Physical Exam Constitutional:      General: She is not in acute distress.    Appearance: She is well-developed.  HENT:     Head: Normocephalic and atraumatic.     Right Ear: External ear normal.     Left Ear: External ear normal.     Nose: Nose normal.     Mouth/Throat:     Pharynx: No oropharyngeal exudate.  Eyes:     General: No scleral icterus.    Conjunctiva/sclera: Conjunctivae normal.     Pupils: Pupils are equal, round, and reactive to light.  Neck:     Thyroid: No thyromegaly.     Vascular: No JVD.  Cardiovascular:     Rate and Rhythm: Normal rate and regular rhythm.     Heart sounds: Normal heart sounds. No murmur heard.  No friction rub. No gallop.   Pulmonary:     Effort: Pulmonary effort is normal. No respiratory distress.     Breath sounds: Normal breath sounds. No wheezing or rales.  Chest:      Chest wall: No tenderness.  Abdominal:     General: Bowel sounds are normal. There is no distension.     Palpations: Abdomen is soft. There is no mass.     Tenderness: There is no abdominal tenderness. There is no guarding or rebound.  Musculoskeletal:        General: No tenderness. Normal range of motion.     Cervical back: Normal range of motion and neck supple.  Lymphadenopathy:     Cervical: No cervical adenopathy.  Skin:    General: Skin is warm and dry.     Findings: No erythema or rash.  Neurological:     Mental Status: She is alert and oriented to person, place, and time.     Cranial Nerves: No cranial nerve deficit.     Motor: No abnormal muscle tone.     Coordination: Coordination normal.     Deep Tendon Reflexes: Reflexes are normal and symmetric. Reflexes normal.  Psychiatric:        Behavior: Behavior normal.        Thought Content: Thought content normal.        Judgment: Judgment normal.           Assessment & Plan:  Well exam. We discussed diet and exercise. Get fasting  labs. For the hearing loss and vertigo, we will refer her to ENT. Alysia Penna, MD

## 2020-06-02 LAB — BASIC METABOLIC PANEL
BUN: 11 mg/dL (ref 7–25)
CO2: 30 mmol/L (ref 20–32)
Calcium: 9.5 mg/dL (ref 8.6–10.4)
Chloride: 101 mmol/L (ref 98–110)
Creat: 0.8 mg/dL (ref 0.50–1.05)
Glucose, Bld: 81 mg/dL (ref 65–99)
Potassium: 4.2 mmol/L (ref 3.5–5.3)
Sodium: 138 mmol/L (ref 135–146)

## 2020-06-02 LAB — CBC WITH DIFFERENTIAL/PLATELET
Absolute Monocytes: 517 cells/uL (ref 200–950)
Basophils Absolute: 19 cells/uL (ref 0–200)
Basophils Relative: 0.4 %
Eosinophils Absolute: 202 cells/uL (ref 15–500)
Eosinophils Relative: 4.3 %
HCT: 38.4 % (ref 35.0–45.0)
Hemoglobin: 13.1 g/dL (ref 11.7–15.5)
Lymphs Abs: 898 cells/uL (ref 850–3900)
MCH: 31.6 pg (ref 27.0–33.0)
MCHC: 34.1 g/dL (ref 32.0–36.0)
MCV: 92.8 fL (ref 80.0–100.0)
MPV: 10.3 fL (ref 7.5–12.5)
Monocytes Relative: 11 %
Neutro Abs: 3064 cells/uL (ref 1500–7800)
Neutrophils Relative %: 65.2 %
Platelets: 228 10*3/uL (ref 140–400)
RBC: 4.14 10*6/uL (ref 3.80–5.10)
RDW: 11.9 % (ref 11.0–15.0)
Total Lymphocyte: 19.1 %
WBC: 4.7 10*3/uL (ref 3.8–10.8)

## 2020-06-02 LAB — TSH: TSH: 1.9 mIU/L (ref 0.40–4.50)

## 2020-06-02 LAB — LIPID PANEL
Cholesterol: 189 mg/dL (ref <200)
HDL: 99 mg/dL (ref >=50)
LDL Cholesterol (Calc): 76 mg/dL (calc)
Non-HDL Cholesterol (Calc): 90 mg/dL (calc) (ref <130)
Total CHOL/HDL Ratio: 1.9 (calc) (ref <5.0)
Triglycerides: 48 mg/dL (ref <150)

## 2020-06-02 LAB — HEPATIC FUNCTION PANEL
AG Ratio: 2.4 (calc) (ref 1.0–2.5)
ALT: 17 U/L (ref 6–29)
AST: 26 U/L (ref 10–35)
Albumin: 4.5 g/dL (ref 3.6–5.1)
Alkaline phosphatase (APISO): 74 U/L (ref 37–153)
Bilirubin, Direct: 0.1 mg/dL (ref 0.0–0.2)
Globulin: 1.9 g/dL (calc) (ref 1.9–3.7)
Indirect Bilirubin: 0.5 mg/dL (calc) (ref 0.2–1.2)
Total Bilirubin: 0.6 mg/dL (ref 0.2–1.2)
Total Protein: 6.4 g/dL (ref 6.1–8.1)

## 2020-06-24 DIAGNOSIS — H401232 Low-tension glaucoma, bilateral, moderate stage: Secondary | ICD-10-CM | POA: Diagnosis not present

## 2020-07-18 DIAGNOSIS — D225 Melanocytic nevi of trunk: Secondary | ICD-10-CM | POA: Diagnosis not present

## 2020-07-18 DIAGNOSIS — L814 Other melanin hyperpigmentation: Secondary | ICD-10-CM | POA: Diagnosis not present

## 2020-07-18 DIAGNOSIS — D239 Other benign neoplasm of skin, unspecified: Secondary | ICD-10-CM | POA: Diagnosis not present

## 2020-07-18 DIAGNOSIS — X32XXXS Exposure to sunlight, sequela: Secondary | ICD-10-CM | POA: Diagnosis not present

## 2020-07-18 DIAGNOSIS — L578 Other skin changes due to chronic exposure to nonionizing radiation: Secondary | ICD-10-CM | POA: Diagnosis not present

## 2020-08-16 DIAGNOSIS — H903 Sensorineural hearing loss, bilateral: Secondary | ICD-10-CM | POA: Diagnosis not present

## 2020-08-16 DIAGNOSIS — R42 Dizziness and giddiness: Secondary | ICD-10-CM | POA: Diagnosis not present

## 2020-08-16 DIAGNOSIS — H90A31 Mixed conductive and sensorineural hearing loss, unilateral, right ear with restricted hearing on the contralateral side: Secondary | ICD-10-CM | POA: Diagnosis not present

## 2020-08-22 DIAGNOSIS — M5431 Sciatica, right side: Secondary | ICD-10-CM | POA: Diagnosis not present

## 2020-08-22 DIAGNOSIS — M25551 Pain in right hip: Secondary | ICD-10-CM | POA: Diagnosis not present

## 2020-08-25 DIAGNOSIS — R600 Localized edema: Secondary | ICD-10-CM | POA: Diagnosis not present

## 2020-08-25 DIAGNOSIS — R21 Rash and other nonspecific skin eruption: Secondary | ICD-10-CM | POA: Diagnosis not present

## 2020-08-25 DIAGNOSIS — M79651 Pain in right thigh: Secondary | ICD-10-CM | POA: Diagnosis not present

## 2020-08-26 DIAGNOSIS — M79651 Pain in right thigh: Secondary | ICD-10-CM | POA: Diagnosis not present

## 2020-08-26 DIAGNOSIS — R2243 Localized swelling, mass and lump, lower limb, bilateral: Secondary | ICD-10-CM | POA: Diagnosis not present

## 2020-08-29 ENCOUNTER — Telehealth: Payer: Self-pay

## 2020-08-29 NOTE — Telephone Encounter (Signed)
Patient has an appointment with you tomorrow morning.

## 2020-08-29 NOTE — Telephone Encounter (Signed)
Patient called in wanting to ask Dr how long should she wait to go back to work. After he shingles rash has started to go away.  Still has a little spot on her foot. Says they caught it fast so that helped along with the medication. She has been taking antiviral and prednisone. Feels great just wanting to know what the guidelines where for returning to work  Patient has a virtual appt set for 08/30/20 @ 8:45am   Patient also says the pain started 6 days before the rash actually started

## 2020-08-30 ENCOUNTER — Telehealth (INDEPENDENT_AMBULATORY_CARE_PROVIDER_SITE_OTHER): Payer: BC Managed Care – PPO | Admitting: Family Medicine

## 2020-08-30 ENCOUNTER — Encounter: Payer: Self-pay | Admitting: Family Medicine

## 2020-08-30 VITALS — Ht 64.76 in | Wt 132.0 lb

## 2020-08-30 DIAGNOSIS — B029 Zoster without complications: Secondary | ICD-10-CM

## 2020-08-30 DIAGNOSIS — G629 Polyneuropathy, unspecified: Secondary | ICD-10-CM

## 2020-08-30 NOTE — Progress Notes (Signed)
Subjective:    Patient ID: Penny Horton, female    DOB: 03/21/1961, 59 y.o.   MRN: [id number]  HPI Virtual Visit via Video Note  I connected with the patient on 08/30/20 at  8:45 AM EST by a video enabled telemedicine application and verified that I am speaking with the correct person using two identifiers.  Location patient: home Location provider:work or home office Persons participating in the virtual visit: patient, provider  I discussed the limitations of evaluation and management by telemedicine and the availability of in person appointments. The patient expressed understanding and agreed to proceed.   HPI: Here to follow up an urgent care visit for shingles and for other issues. She is currently staying in [place]. About 10 days ago she developed a severe pain in the right lateral hip area, and a few days later a red rash appeared on the right posterior and lateral right thigh and on the right foot. No blisters ever formed. She went to an urgent care and was diagnosed with shingles. She was treated with Valtrex and Prednisone, and now she is feeling much better. The pain is minimal, and the rash is fading away. However at the same time she has devloped numbness and tingling in the toes of both feet. She has never had this before. Her hands are not involved.    ROS: See pertinent positives and negatives per HPI.  Past Medical History:  Diagnosis Date  . Arthritis    osteoarthritis  . Cataract   . Degenerative disc disease, lumbar   . Glaucoma   . Neuromuscular disorder ([place]) 03/2011   lumbar area buldging disk/steroid injection    Past Surgical History:  Procedure Laterality Date  . COLONOSCOPY  09/26/2019   per Dr. Steward Drone at [facility], no polyps, repeat in 5 years (mother had polyps)   . D& E  1995  . HERNIA REPAIR Bilateral 2007   inguinal hernias, using mesh   . INGUINAL HERNIA REPAIR  2005   bilateral    Family History    Problem Relation Age of Onset  . Colon polyps Mother   . Arthritis Mother   . Parkinson's disease Father   . Breast cancer Neg Hx      Current Outpatient Medications:  .  Cholecalciferol (VITAMIN D3 PO), Take 5,000 Units by mouth daily., Disp: , Rfl:  .  Cyanocobalamin (VITAMIN B-12 PO), Take by mouth daily., Disp: , Rfl:  .  fish oil-omega-3 fatty acids 1000 MG capsule, Take 1 g by mouth as needed.  , Disp: , Rfl:  .  latanoprost (XALATAN) 0.005 % ophthalmic solution, Place 1 drop into both eyes nightly., Disp: , Rfl:  .  Magnesium Gluconate 550 MG TABS, Take by mouth., Disp: , Rfl:  .  predniSONE (DELTASONE) 10 MG tablet, Take by mouth., Disp: , Rfl:  .  tretinoin (RETIN-A) 0.737 % cream, 1 APPLICATION APPLY ON THE SKIN AT BEDTIME FOR SKIN MAINTENANCE, Disp: , Rfl: 4 .  Turmeric 500 MG CAPS, Take 500 mg by mouth daily., Disp: , Rfl:  .  valACYclovir (VALTREX) 1000 MG tablet, Take 1,000 mg by mouth 3 (three) times daily., Disp: , Rfl:   EXAM:  VITALS per patient if applicable:  GENERAL: alert, oriented, appears well and in no acute distress  HEENT: atraumatic, conjunttiva clear, no obvious abnormalities on inspection of external nose and ears  NECK: normal movements of the head and neck  LUNGS: on inspection no signs of  respiratory distress, breathing rate appears normal, no obvious gross SOB, gasping or wheezing  CV: no obvious cyanosis  MS: moves all visible extremities without noticeable abnormality  PSYCH/NEURO: pleasant and cooperative, no obvious depression or anxiety, speech and thought processing grossly intact  ASSESSMENT AND PLAN: She is recovering from shingles with appropriate treatment on Valtrex and Prednisone. I advised her to finish out the medications. She also has a peripheral neuropathy now, and it seems to be a reactive neuropathy, possibly to one of the medications. She will take a daily B complex vitamin and give this some time. Hopefully it will resolve  in the coming weeks. If not, I asked her see [place] when she is back in [place] so we can do a lab workup with a B12 level, etc.  Alysia Penna, MD   Discussed the following assessment and plan:  No diagnosis found.     I discussed the assessment and treatment plan with the patient. The patient was provided an opportunity to ask questions and all were answered. The patient agreed with the plan and demonstrated an understanding of the instructions.   The patient was advised to call back or seek an in-person evaluation if the symptoms worsen or if the condition fails to improve as anticipated.     Review of Systems     Objective:   Physical Exam        Assessment & Plan:

## 2020-09-20 DIAGNOSIS — J029 Acute pharyngitis, unspecified: Secondary | ICD-10-CM | POA: Diagnosis not present

## 2020-09-20 DIAGNOSIS — Z20822 Contact with and (suspected) exposure to covid-19: Secondary | ICD-10-CM | POA: Diagnosis not present

## 2020-09-29 DIAGNOSIS — H5213 Myopia, bilateral: Secondary | ICD-10-CM | POA: Diagnosis not present

## 2020-09-29 DIAGNOSIS — H401232 Low-tension glaucoma, bilateral, moderate stage: Secondary | ICD-10-CM | POA: Diagnosis not present

## 2020-09-29 DIAGNOSIS — H25813 Combined forms of age-related cataract, bilateral: Secondary | ICD-10-CM | POA: Diagnosis not present

## 2020-09-30 ENCOUNTER — Telehealth: Payer: Self-pay | Admitting: Family Medicine

## 2020-09-30 NOTE — Telephone Encounter (Signed)
pt had shingles; how long should she wait until she has her booster shot [phone] please advise

## 2020-10-04 DIAGNOSIS — R42 Dizziness and giddiness: Secondary | ICD-10-CM | POA: Diagnosis not present

## 2020-10-05 NOTE — Telephone Encounter (Signed)
Tried to contact patient, unable to leave voicemail.

## 2020-10-05 NOTE — Telephone Encounter (Signed)
She should wait until 2 weeks have passed after all the shingles symptoms have resolved

## 2020-12-06 DIAGNOSIS — Z03818 Encounter for observation for suspected exposure to other biological agents ruled out: Secondary | ICD-10-CM | POA: Diagnosis not present

## 2020-12-06 DIAGNOSIS — Z20822 Contact with and (suspected) exposure to covid-19: Secondary | ICD-10-CM | POA: Diagnosis not present

## 2021-01-06 DIAGNOSIS — S0501XA Injury of conjunctiva and corneal abrasion without foreign body, right eye, initial encounter: Secondary | ICD-10-CM | POA: Diagnosis not present

## 2021-01-24 DIAGNOSIS — H25813 Combined forms of age-related cataract, bilateral: Secondary | ICD-10-CM | POA: Diagnosis not present

## 2021-01-24 DIAGNOSIS — H401132 Primary open-angle glaucoma, bilateral, moderate stage: Secondary | ICD-10-CM | POA: Diagnosis not present

## 2021-01-24 DIAGNOSIS — H5213 Myopia, bilateral: Secondary | ICD-10-CM | POA: Diagnosis not present

## 2021-02-07 ENCOUNTER — Other Ambulatory Visit: Payer: Self-pay | Admitting: Obstetrics and Gynecology

## 2021-02-07 DIAGNOSIS — Z1231 Encounter for screening mammogram for malignant neoplasm of breast: Secondary | ICD-10-CM

## 2021-02-23 DIAGNOSIS — Z03818 Encounter for observation for suspected exposure to other biological agents ruled out: Secondary | ICD-10-CM | POA: Diagnosis not present

## 2021-03-01 DIAGNOSIS — Z20822 Contact with and (suspected) exposure to covid-19: Secondary | ICD-10-CM | POA: Diagnosis not present

## 2021-04-12 DIAGNOSIS — K640 First degree hemorrhoids: Secondary | ICD-10-CM | POA: Diagnosis not present

## 2021-05-01 DIAGNOSIS — Z6822 Body mass index (BMI) 22.0-22.9, adult: Secondary | ICD-10-CM | POA: Diagnosis not present

## 2021-05-01 DIAGNOSIS — Z01419 Encounter for gynecological examination (general) (routine) without abnormal findings: Secondary | ICD-10-CM | POA: Diagnosis not present

## 2021-05-17 ENCOUNTER — Other Ambulatory Visit: Payer: Self-pay

## 2021-05-17 ENCOUNTER — Ambulatory Visit
Admission: RE | Admit: 2021-05-17 | Discharge: 2021-05-17 | Disposition: A | Discharge status: Home / Self Care | Payer: BC Managed Care – PPO | Source: Ambulatory Visit | Attending: Obstetrics and Gynecology | Admitting: Obstetrics and Gynecology

## 2021-05-17 DIAGNOSIS — Z1231 Encounter for screening mammogram for malignant neoplasm of breast: Secondary | ICD-10-CM | POA: Diagnosis not present

## 2021-05-31 DIAGNOSIS — H401132 Primary open-angle glaucoma, bilateral, moderate stage: Secondary | ICD-10-CM | POA: Diagnosis not present

## 2021-05-31 DIAGNOSIS — H25813 Combined forms of age-related cataract, bilateral: Secondary | ICD-10-CM | POA: Diagnosis not present

## 2021-05-31 DIAGNOSIS — H5213 Myopia, bilateral: Secondary | ICD-10-CM | POA: Diagnosis not present

## 2021-09-08 DIAGNOSIS — H401132 Primary open-angle glaucoma, bilateral, moderate stage: Secondary | ICD-10-CM | POA: Diagnosis not present

## 2021-09-26 DIAGNOSIS — H5213 Myopia, bilateral: Secondary | ICD-10-CM | POA: Diagnosis not present

## 2021-09-26 DIAGNOSIS — H25813 Combined forms of age-related cataract, bilateral: Secondary | ICD-10-CM | POA: Diagnosis not present

## 2021-09-26 DIAGNOSIS — H401132 Primary open-angle glaucoma, bilateral, moderate stage: Secondary | ICD-10-CM | POA: Diagnosis not present

## 2021-09-26 DIAGNOSIS — H01004 Unspecified blepharitis left upper eyelid: Secondary | ICD-10-CM | POA: Diagnosis not present

## 2022-04-12 DIAGNOSIS — H401132 Primary open-angle glaucoma, bilateral, moderate stage: Secondary | ICD-10-CM | POA: Diagnosis not present

## 2022-04-12 DIAGNOSIS — H25813 Combined forms of age-related cataract, bilateral: Secondary | ICD-10-CM | POA: Diagnosis not present

## 2022-04-12 DIAGNOSIS — H5213 Myopia, bilateral: Secondary | ICD-10-CM | POA: Diagnosis not present

## 2022-04-12 DIAGNOSIS — H02821 Cysts of right upper eyelid: Secondary | ICD-10-CM | POA: Diagnosis not present

## 2022-04-13 ENCOUNTER — Other Ambulatory Visit: Payer: Self-pay | Admitting: Obstetrics and Gynecology

## 2022-04-13 DIAGNOSIS — Z1231 Encounter for screening mammogram for malignant neoplasm of breast: Secondary | ICD-10-CM

## 2022-04-17 ENCOUNTER — Encounter: Payer: BC Managed Care – PPO | Admitting: Family Medicine

## 2022-06-06 DIAGNOSIS — H90A21 Sensorineural hearing loss, unilateral, right ear, with restricted hearing on the contralateral side: Secondary | ICD-10-CM | POA: Diagnosis not present

## 2022-06-06 DIAGNOSIS — H903 Sensorineural hearing loss, bilateral: Secondary | ICD-10-CM | POA: Diagnosis not present

## 2022-06-06 DIAGNOSIS — H90A31 Mixed conductive and sensorineural hearing loss, unilateral, right ear with restricted hearing on the contralateral side: Secondary | ICD-10-CM | POA: Diagnosis not present

## 2022-06-07 ENCOUNTER — Ambulatory Visit
Admission: RE | Admit: 2022-06-07 | Discharge: 2022-06-07 | Disposition: A | Discharge status: Home / Self Care | Payer: BC Managed Care – PPO | Source: Ambulatory Visit | Attending: Obstetrics and Gynecology | Admitting: Obstetrics and Gynecology

## 2022-06-07 ENCOUNTER — Ambulatory Visit (INDEPENDENT_AMBULATORY_CARE_PROVIDER_SITE_OTHER): Payer: BC Managed Care – PPO | Admitting: Family Medicine

## 2022-06-07 ENCOUNTER — Encounter: Payer: Self-pay | Admitting: Family Medicine

## 2022-06-07 VITALS — BP 108/68 | HR 52 | Temp 98.5°F | Ht 65.0 in | Wt 127.1 lb

## 2022-06-07 DIAGNOSIS — Z1231 Encounter for screening mammogram for malignant neoplasm of breast: Secondary | ICD-10-CM

## 2022-06-07 DIAGNOSIS — Z0000 Encounter for general adult medical examination without abnormal findings: Secondary | ICD-10-CM | POA: Diagnosis not present

## 2022-06-07 LAB — LIPID PANEL
Cholesterol: 216 mg/dL — ABNORMAL HIGH (ref 0–200)
HDL: 106.3 mg/dL (ref >39.00)
LDL Cholesterol: 100 mg/dL — ABNORMAL HIGH (ref 0–99)
NonHDL: 109.6
Total CHOL/HDL Ratio: 2
Triglycerides: 49 mg/dL (ref 0.0–149.0)
VLDL: 9.8 mg/dL (ref 0.0–40.0)

## 2022-06-07 LAB — CBC WITH DIFFERENTIAL/PLATELET
Basophils Absolute: 0 10*3/uL (ref 0.0–0.1)
Basophils Relative: 1 % (ref 0.0–3.0)
Eosinophils Absolute: 0.1 10*3/uL (ref 0.0–0.7)
Eosinophils Relative: 1.7 % (ref 0.0–5.0)
HCT: 43.7 % (ref 36.0–46.0)
Hemoglobin: 14.6 g/dL (ref 12.0–15.0)
Lymphocytes Relative: 24.1 % (ref 12.0–46.0)
Lymphs Abs: 0.9 10*3/uL (ref 0.7–4.0)
MCHC: 33.4 g/dL (ref 30.0–36.0)
MCV: 95 fl (ref 78.0–100.0)
Monocytes Absolute: 0.5 10*3/uL (ref 0.1–1.0)
Monocytes Relative: 12.2 % — ABNORMAL HIGH (ref 3.0–12.0)
Neutro Abs: 2.4 10*3/uL (ref 1.4–7.7)
Neutrophils Relative %: 61 % (ref 43.0–77.0)
Platelets: 224 10*3/uL (ref 150.0–400.0)
RBC: 4.6 Mil/uL (ref 3.87–5.11)
RDW: 12.3 % (ref 11.5–15.5)
WBC: 3.9 10*3/uL — ABNORMAL LOW (ref 4.0–10.5)

## 2022-06-07 LAB — BASIC METABOLIC PANEL
BUN: 14 mg/dL (ref 6–23)
CO2: 29 mEq/L (ref 19–32)
Calcium: 9.8 mg/dL (ref 8.4–10.5)
Chloride: 101 mEq/L (ref 96–112)
Creatinine, Ser: 0.87 mg/dL (ref 0.40–1.20)
GFR: 72.11 mL/min (ref >60.00)
Glucose, Bld: 76 mg/dL (ref 70–99)
Potassium: 4.3 mEq/L (ref 3.5–5.1)
Sodium: 141 mEq/L (ref 135–145)

## 2022-06-07 LAB — HEPATIC FUNCTION PANEL
ALT: 15 U/L (ref 0–35)
AST: 25 U/L (ref 0–37)
Albumin: 4.7 g/dL (ref 3.5–5.2)
Alkaline Phosphatase: 78 U/L (ref 39–117)
Bilirubin, Direct: 0.1 mg/dL (ref 0.0–0.3)
Total Bilirubin: 0.6 mg/dL (ref 0.2–1.2)
Total Protein: 7.1 g/dL (ref 6.0–8.3)

## 2022-06-07 LAB — TSH: TSH: 1.54 u[IU]/mL (ref 0.35–5.50)

## 2022-06-07 LAB — HEMOGLOBIN A1C: Hgb A1c MFr Bld: 5.4 % (ref 4.6–6.5)

## 2022-06-07 NOTE — Progress Notes (Signed)
   Subjective:    Patient ID: Penny Horton, female    DOB: 01-05-1961, 61 y.o.   MRN: [id number]  HPI Here for a well exam. She feels great. Her glaucoma is stable with normal pressures. She has had a asymmetric hearing loss for years and she saw her ENT here yesterday. He finally convinced her to get a brain MRI, and she will get this in [place] next week.   Review of Systems  Constitutional: Negative.   HENT:  Positive for hearing loss.   Eyes: Negative.   Respiratory: Negative.    Cardiovascular: Negative.   Gastrointestinal: Negative.   Genitourinary:  Negative for decreased urine volume, difficulty urinating, dyspareunia, dysuria, enuresis, flank pain, frequency, hematuria, pelvic pain and urgency.  Musculoskeletal: Negative.   Skin: Negative.   Neurological: Negative.  Negative for headaches.  Psychiatric/Behavioral: Negative.         Objective:   Physical Exam Constitutional:      General: She is not in acute distress.    Appearance: Normal appearance. She is well-developed.  HENT:     Head: Normocephalic and atraumatic.     Right Ear: External ear normal.     Left Ear: External ear normal.     Nose: Nose normal.     Mouth/Throat:     Pharynx: No oropharyngeal exudate.  Eyes:     General: No scleral icterus.    Conjunctiva/sclera: Conjunctivae normal.     Pupils: Pupils are equal, round, and reactive to light.  Neck:     Thyroid: No thyromegaly.     Vascular: No JVD.  Cardiovascular:     Rate and Rhythm: Normal rate and regular rhythm.     Heart sounds: Normal heart sounds. No murmur heard.    No friction rub. No gallop.  Pulmonary:     Effort: Pulmonary effort is normal. No respiratory distress.     Breath sounds: Normal breath sounds. No wheezing or rales.  Chest:     Chest wall: No tenderness.  Abdominal:     General: Bowel sounds are normal. There is no distension.     Palpations: Abdomen is soft. There is no mass.     Tenderness: There is  no abdominal tenderness. There is no guarding or rebound.  Musculoskeletal:        General: No tenderness. Normal range of motion.     Cervical back: Normal range of motion and neck supple.  Lymphadenopathy:     Cervical: No cervical adenopathy.  Skin:    General: Skin is warm and dry.     Findings: No erythema or rash.  Neurological:     Mental Status: She is alert and oriented to person, place, and time.     Cranial Nerves: No cranial nerve deficit.     Motor: No abnormal muscle tone.     Coordination: Coordination normal.     Deep Tendon Reflexes: Reflexes are normal and symmetric. Reflexes normal.  Psychiatric:        Behavior: Behavior normal.        Thought Content: Thought content normal.        Judgment: Judgment normal.           Assessment & Plan:  Well exam. We discussed diet and exercise. Get fasting labs. Alysia Penna, MD

## 2022-06-12 DIAGNOSIS — D239 Other benign neoplasm of skin, unspecified: Secondary | ICD-10-CM | POA: Diagnosis not present

## 2022-06-12 DIAGNOSIS — D229 Melanocytic nevi, unspecified: Secondary | ICD-10-CM | POA: Diagnosis not present

## 2022-06-12 DIAGNOSIS — L821 Other seborrheic keratosis: Secondary | ICD-10-CM | POA: Diagnosis not present

## 2022-06-12 DIAGNOSIS — L309 Dermatitis, unspecified: Secondary | ICD-10-CM | POA: Diagnosis not present

## 2022-06-13 DIAGNOSIS — H9041 Sensorineural hearing loss, unilateral, right ear, with unrestricted hearing on the contralateral side: Secondary | ICD-10-CM | POA: Diagnosis not present

## 2022-06-13 DIAGNOSIS — R9082 White matter disease, unspecified: Secondary | ICD-10-CM | POA: Diagnosis not present

## 2022-06-27 DIAGNOSIS — H6983 Other specified disorders of Eustachian tube, bilateral: Secondary | ICD-10-CM | POA: Diagnosis not present

## 2022-06-28 DIAGNOSIS — Z13820 Encounter for screening for osteoporosis: Secondary | ICD-10-CM | POA: Diagnosis not present

## 2022-06-28 DIAGNOSIS — Z01419 Encounter for gynecological examination (general) (routine) without abnormal findings: Secondary | ICD-10-CM | POA: Diagnosis not present

## 2022-06-28 DIAGNOSIS — Z6821 Body mass index (BMI) 21.0-21.9, adult: Secondary | ICD-10-CM | POA: Diagnosis not present

## 2022-06-29 ENCOUNTER — Telehealth (INDEPENDENT_AMBULATORY_CARE_PROVIDER_SITE_OTHER): Payer: BC Managed Care – PPO | Admitting: Family Medicine

## 2022-06-29 ENCOUNTER — Encounter: Payer: Self-pay | Admitting: Family Medicine

## 2022-06-29 DIAGNOSIS — H9041 Sensorineural hearing loss, unilateral, right ear, with unrestricted hearing on the contralateral side: Secondary | ICD-10-CM | POA: Diagnosis not present

## 2022-06-29 DIAGNOSIS — R9089 Other abnormal findings on diagnostic imaging of central nervous system: Secondary | ICD-10-CM | POA: Diagnosis not present

## 2022-06-29 DIAGNOSIS — H9191 Unspecified hearing loss, right ear: Secondary | ICD-10-CM | POA: Insufficient documentation

## 2022-06-29 DIAGNOSIS — H93A1 Pulsatile tinnitus, right ear: Secondary | ICD-10-CM

## 2022-06-29 NOTE — Progress Notes (Signed)
Subjective:    Patient ID: Penny Horton, female    DOB: Mar 07, 1961, 61 y.o.   MRN: [id number]  HPI Virtual Visit via Video Note  I connected with the patient on 06/29/22 at  1:30 PM EDT by a video enabled telemedicine application and verified that I am speaking with the correct person using two identifiers.  Location patient: home Location provider:work or home office Persons participating in the virtual visit: patient, provider  I discussed the limitations of evaluation and management by telemedicine and the availability of in person appointments. The patient expressed understanding and agreed to proceed.   HPI: Here to discuss the results of a recent brain MRI scan she had performed on 06-13-22 at [facility] in [place]. 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 She has been seeing Dr. Sammuel Bailiff for this, and he ordered this MRI. The report came back showing the right 7th and 8th cranial nerves to be normal, but it also described "several T2/FLAIR hyperintense white matter lesions measuring up to 9 mm in the right parietal lobe." When Dr. Redmond Baseman reviewed this with her, her advised her to see [place] about the white matter lesions. She has no other neurologic symptoms, no headaches or dizziness. She has read about a Dr. Kern Reap who is a NeuroOtologist at [facility], and she asks if he could help her.    ROS: See pertinent positives and negatives per HPI.  Past Medical History:  Diagnosis Date   Arthritis    osteoarthritis   Cataract    Degenerative disc disease, lumbar    Glaucoma    Neuromuscular disorder ([place]) 03/2011   lumbar area buldging disk/steroid injection    Past Surgical History:  Procedure Laterality Date   COLONOSCOPY  09/26/2019   per Dr. Steward Drone at [facility], no polyps, repeat in 5 years (mother had polyps)    D& [place] Bilateral 2007    inguinal hernias, using mesh    INGUINAL HERNIA REPAIR  2005   bilateral    Family History  Problem Relation Age of Onset   Colon polyps Mother    Arthritis Mother    Parkinson's disease Father    Breast cancer Neg Hx      Current Outpatient Medications:    Cholecalciferol (VITAMIN D3 PO), Take 5,000 Units by mouth daily., Disp: , Rfl:    Cyanocobalamin (VITAMIN B-12 PO), Take by mouth daily., Disp: , Rfl:    fish oil-omega-3 fatty acids 1000 MG capsule, Take 1 g by mouth as needed.  , Disp: , Rfl:    LATANOPROST OP, latanoprost Take No date recorded No form recorded No frequency recorded No route recorded No set duration recorded No set duration amount recorded active No dosage strength recorded No dosage strength units of measure recorded, Disp: , Rfl:    Magnesium Gluconate 550 MG TABS, Take by mouth., Disp: , Rfl:    tretinoin (RETIN-A) 1.700 % cream, 1 APPLICATION APPLY ON THE SKIN AT BEDTIME FOR SKIN MAINTENANCE, Disp: , Rfl: 4  EXAM:  VITALS per patient if applicable:  GENERAL: alert, oriented, appears well and in no acute distress  HEENT: atraumatic, conjunttiva clear, no obvious abnormalities on inspection of external nose and ears  NECK: normal movements of the head and neck  LUNGS: on inspection no signs of respiratory distress, breathing rate appears normal, no obvious gross SOB, gasping or wheezing  CV: no obvious cyanosis  MS: moves all visible extremities without noticeable abnormality  PSYCH/NEURO: pleasant and cooperative, no obvious depression or anxiety, speech and thought processing grossly intact  ASSESSMENT AND PLAN: She will follow up with Dr. Redmond Baseman about the hearing loss and tinnitus, but I recommended she see a Neurologist rather than a NeuroOtologist to look at this scan and to examine her. She agreed. She will talk to some friends who live in the [place] or [place] areas to get the name of a Neurologist there, and she will let us know so we can  do a referral.  Alysia Penna, MD  Discussed the following assessment and plan:  No diagnosis found.     I discussed the assessment and treatment plan with the patient. The patient was provided an opportunity to ask questions and all were answered. The patient agreed with the plan and demonstrated an understanding of the instructions.   The patient was advised to call back or seek an in-person evaluation if the symptoms worsen or if the condition fails to improve as anticipated.      Review of Systems     Objective:   Physical Exam        Assessment & Plan:

## 2022-07-04 ENCOUNTER — Encounter: Payer: Self-pay | Admitting: Family Medicine

## 2022-07-04 DIAGNOSIS — H93A1 Pulsatile tinnitus, right ear: Secondary | ICD-10-CM

## 2022-07-04 DIAGNOSIS — H9041 Sensorineural hearing loss, unilateral, right ear, with unrestricted hearing on the contralateral side: Secondary | ICD-10-CM

## 2022-07-04 DIAGNOSIS — R9089 Other abnormal findings on diagnostic imaging of central nervous system: Secondary | ICD-10-CM

## 2022-07-05 NOTE — Telephone Encounter (Signed)
I did the referral 

## 2022-07-12 ENCOUNTER — Encounter: Payer: Self-pay | Admitting: Family Medicine

## 2022-07-12 NOTE — Telephone Encounter (Signed)
There is not much we can do on our end. Make sure she is on cancellation lists with them

## 2022-07-13 NOTE — Telephone Encounter (Signed)
Please contact [facility] at [phone] for a quicker appt about unilateral right sided neurosensory hearing loss, right sided pulsatile tinnitus, and white matter lesions in the right parietal lobe of brain on MRI scan

## 2022-07-17 ENCOUNTER — Telehealth: Payer: Self-pay | Admitting: Family Medicine

## 2022-07-17 NOTE — Telephone Encounter (Signed)
Pt is calling and she said [facility] neurologist is a month or so out for new patient and in order to see sooner appt dr fry must call and talk md to md. Dr Sarajane Jews would need to call  [facility] at [phone]. Pt has abn brain scan

## 2022-07-18 NOTE — Telephone Encounter (Signed)
Message complete. See My Chart message from 07/12/22.

## 2022-07-25 DIAGNOSIS — H93A1 Pulsatile tinnitus, right ear: Secondary | ICD-10-CM | POA: Diagnosis not present

## 2022-07-25 DIAGNOSIS — R9089 Other abnormal findings on diagnostic imaging of central nervous system: Secondary | ICD-10-CM | POA: Diagnosis not present

## 2022-07-30 DIAGNOSIS — H93A1 Pulsatile tinnitus, right ear: Secondary | ICD-10-CM | POA: Diagnosis not present

## 2022-07-30 DIAGNOSIS — H903 Sensorineural hearing loss, bilateral: Secondary | ICD-10-CM | POA: Diagnosis not present

## 2022-08-10 DIAGNOSIS — H93A1 Pulsatile tinnitus, right ear: Secondary | ICD-10-CM | POA: Diagnosis not present

## 2022-08-27 DIAGNOSIS — H93A1 Pulsatile tinnitus, right ear: Secondary | ICD-10-CM | POA: Diagnosis not present

## 2022-08-27 DIAGNOSIS — H90A31 Mixed conductive and sensorineural hearing loss, unilateral, right ear with restricted hearing on the contralateral side: Secondary | ICD-10-CM | POA: Diagnosis not present

## 2022-09-12 DIAGNOSIS — H93A1 Pulsatile tinnitus, right ear: Secondary | ICD-10-CM | POA: Diagnosis not present

## 2022-10-29 DIAGNOSIS — H401132 Primary open-angle glaucoma, bilateral, moderate stage: Secondary | ICD-10-CM | POA: Diagnosis not present

## 2022-10-29 DIAGNOSIS — H25033 Anterior subcapsular polar age-related cataract, bilateral: Secondary | ICD-10-CM | POA: Diagnosis not present

## 2022-12-06 DIAGNOSIS — H93A1 Pulsatile tinnitus, right ear: Secondary | ICD-10-CM | POA: Diagnosis not present

## 2022-12-19 DIAGNOSIS — H93A1 Pulsatile tinnitus, right ear: Secondary | ICD-10-CM | POA: Diagnosis not present

## 2022-12-22 IMAGING — MG MM DIGITAL SCREENING BILAT W/ TOMO AND CAD
8 series · 9 of 24 positions shown · non-contrast
Comparison: Previous exam(s).

CLINICAL DATA: Screening.

EXAM:
DIGITAL SCREENING BILATERAL MAMMOGRAM WITH TOMOSYNTHESIS AND CAD
TECHNIQUE: Bilateral screening digital craniocaudal and mediolateral oblique
mammograms were obtained. Bilateral screening digital breast
tomosynthesis was performed. The images were evaluated with
computer-aided detection.

[L CC synth-2D]
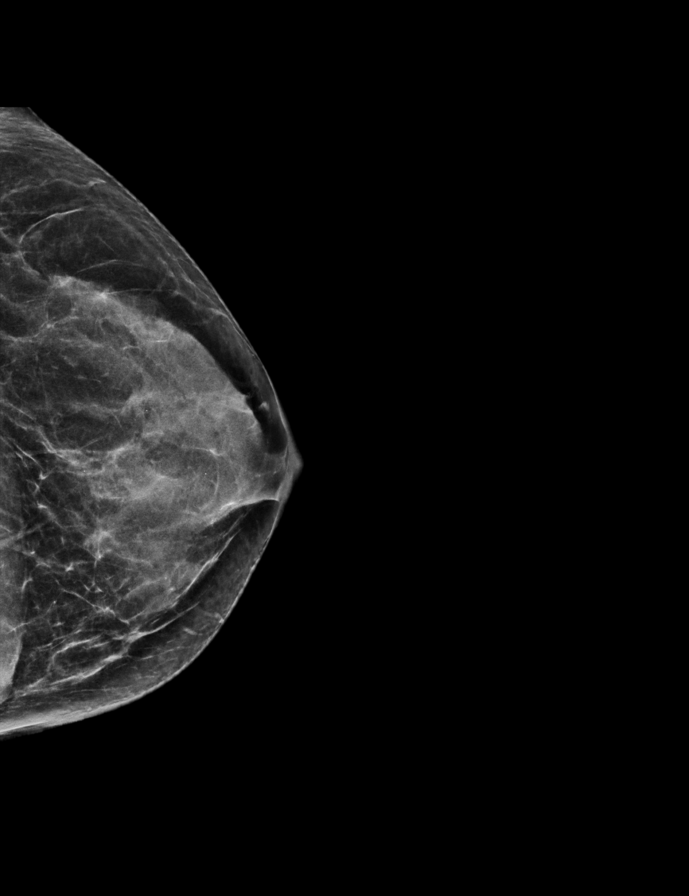

[R MLO synth-2D]
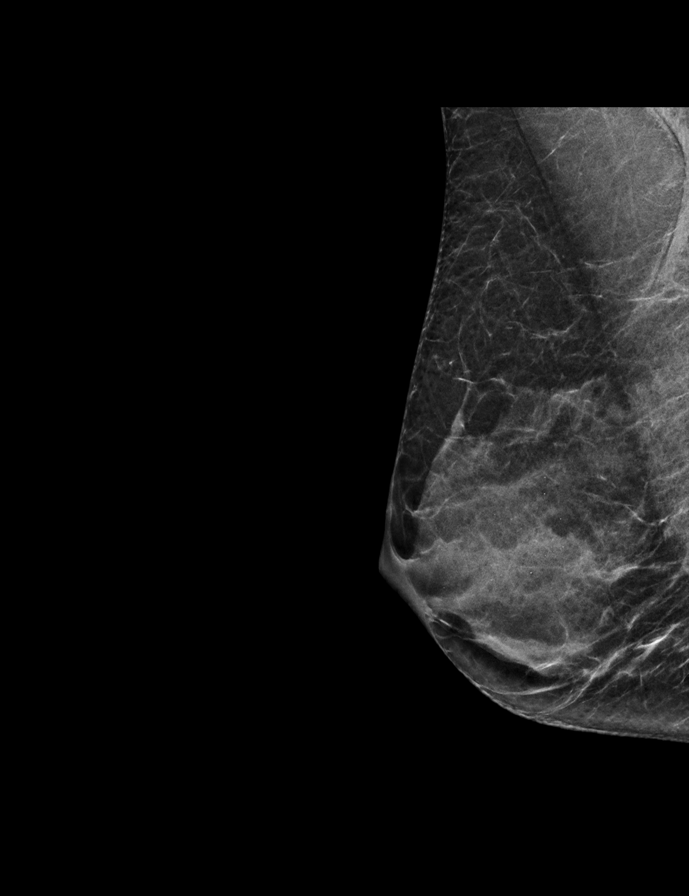

[L MLO synth-2D]
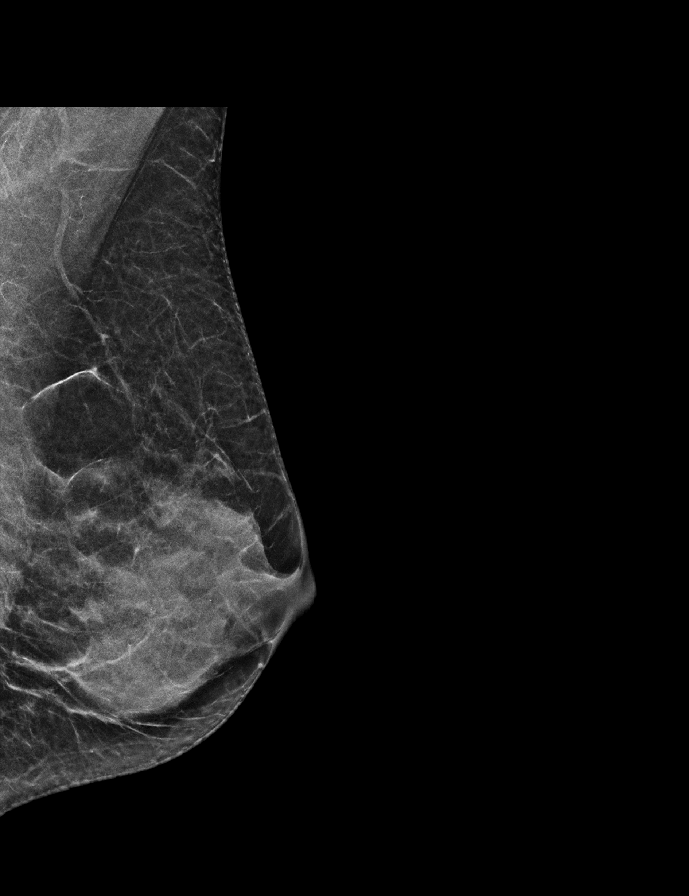

[R CC synth-2D]
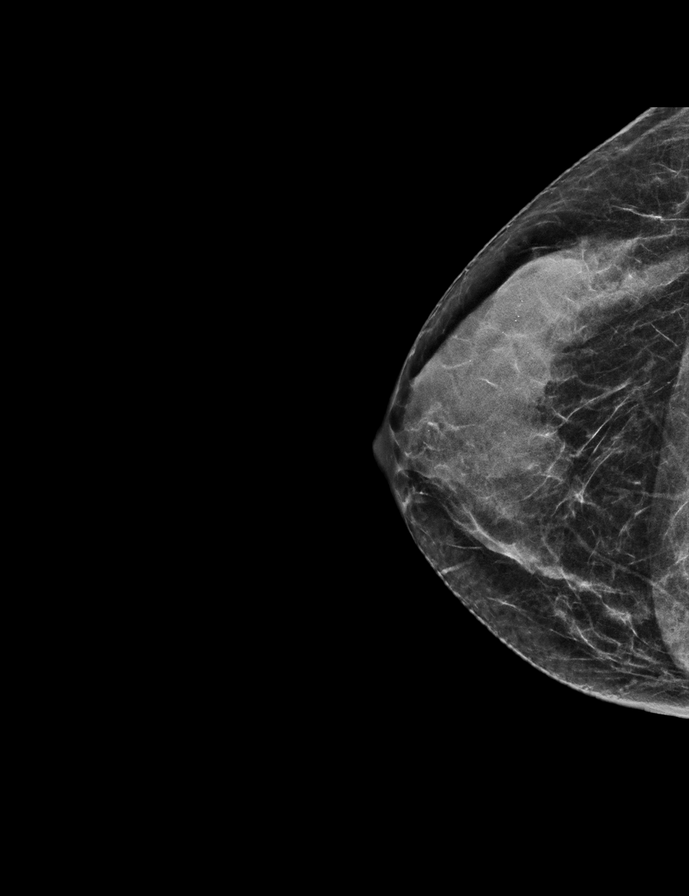

[R CC tomo · 2 of 46 frames shown]
[frame 15/46]
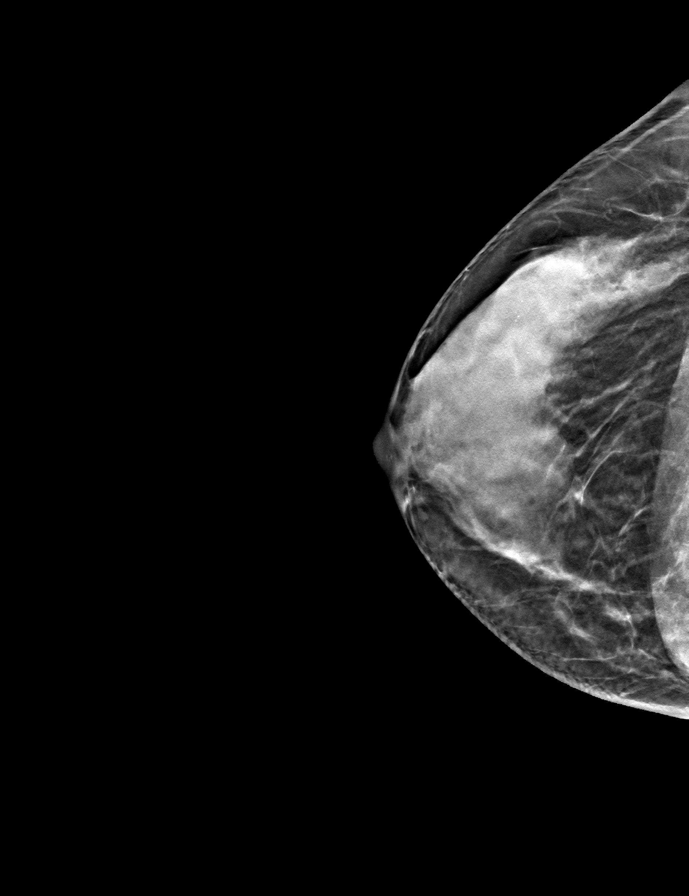
[frame 23/46]
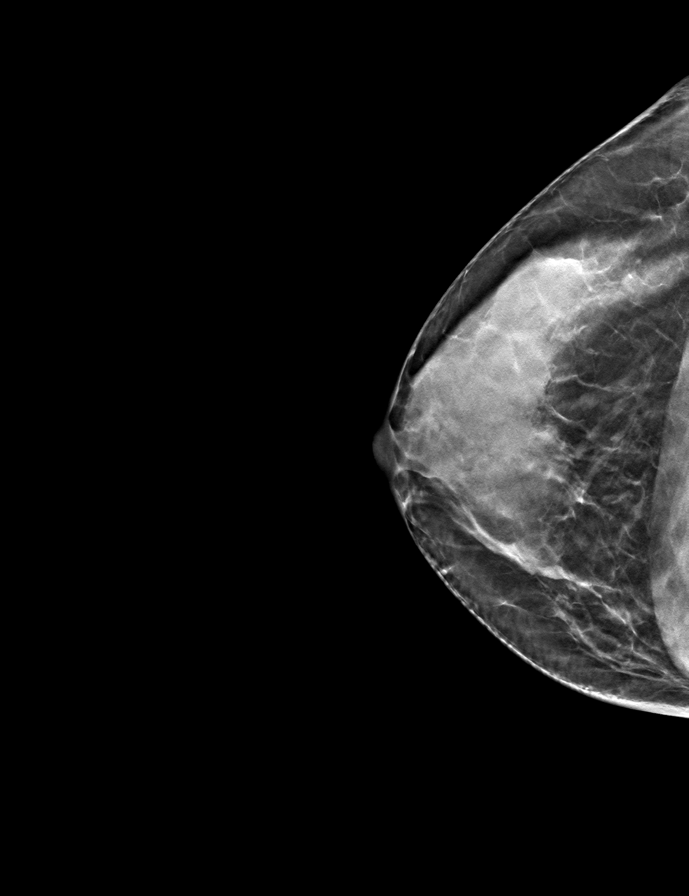

[R MLO tomo · tomo slice 22/43.0]
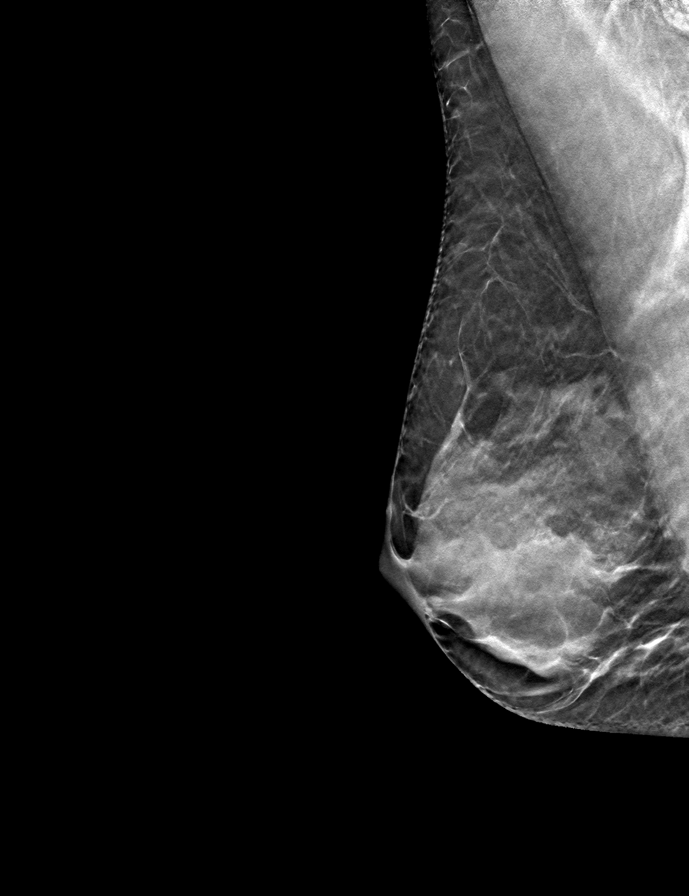

[L MLO tomo · tomo slice 23/45.0]
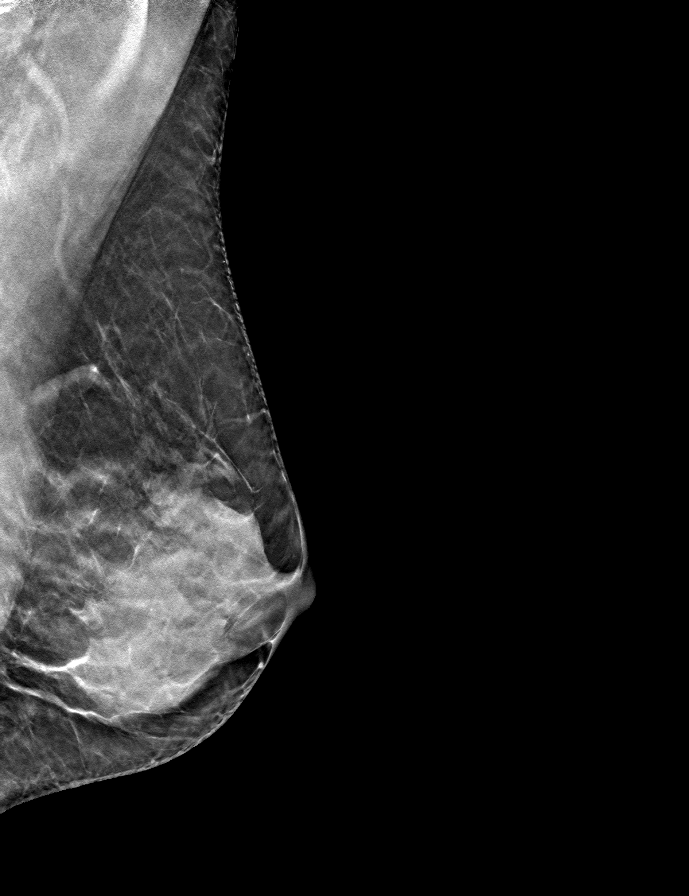

[L CC tomo · tomo slice 23/46.0]
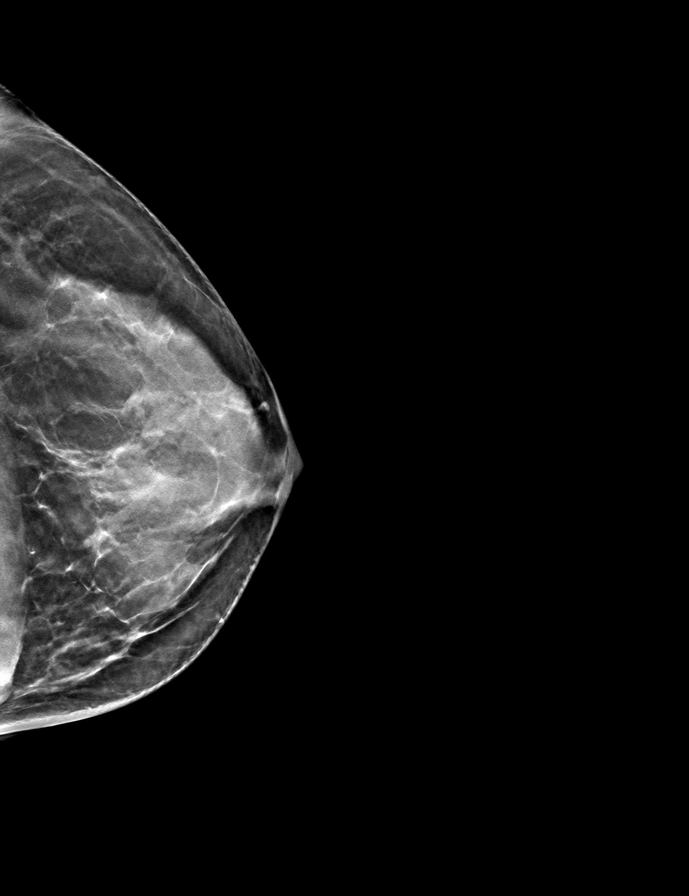

[9 of 24 positions shown; findings below may reference images not displayed]

ACR Breast Density Category c: The breast tissue is heterogeneously
dense, which may obscure small masses.
FINDINGS: There are no findings suspicious for malignancy.
IMPRESSION: No mammographic evidence of malignancy. A result letter of this
screening mammogram will be mailed directly to the patient.

RECOMMENDATION:
Screening mammogram in one year. (Code:Q3-W-BC3)

BI-RADS CATEGORY  1: Negative.

## 2022-12-28 DIAGNOSIS — L988 Other specified disorders of the skin and subcutaneous tissue: Secondary | ICD-10-CM | POA: Diagnosis not present

## 2023-01-03 DIAGNOSIS — H8091 Unspecified otosclerosis, right ear: Secondary | ICD-10-CM | POA: Diagnosis not present

## 2023-01-03 DIAGNOSIS — H90A31 Mixed conductive and sensorineural hearing loss, unilateral, right ear with restricted hearing on the contralateral side: Secondary | ICD-10-CM | POA: Diagnosis not present

## 2023-01-03 DIAGNOSIS — H93A1 Pulsatile tinnitus, right ear: Secondary | ICD-10-CM | POA: Diagnosis not present

## 2023-01-11 ENCOUNTER — Telehealth: Payer: Self-pay | Admitting: Family Medicine

## 2023-01-11 NOTE — Telephone Encounter (Signed)
FYI Spoke with patient, she stated that she  has been seen at [facility] in which she has had multi head scans,  also she has been seen at ENT in [place].   Patient has a lot of questions and concerns about past testing and future testing. [facility] notes in care everywhere and ENT in [place] notes scanned into chart.    Advised patient to schedule a video visit to discuss all concerns with Dr. Sarajane Jews.    Patient stated that she will contact office to schedule an video visit.

## 2023-01-11 NOTE — Telephone Encounter (Signed)
Wants to talk to someone regarding labs for pulsatile tinnitus.

## 2023-01-16 ENCOUNTER — Telehealth (INDEPENDENT_AMBULATORY_CARE_PROVIDER_SITE_OTHER): Payer: BC Managed Care – PPO | Admitting: Family Medicine

## 2023-01-16 ENCOUNTER — Encounter: Payer: Self-pay | Admitting: Family Medicine

## 2023-01-16 DIAGNOSIS — H93A1 Pulsatile tinnitus, right ear: Secondary | ICD-10-CM | POA: Diagnosis not present

## 2023-01-16 NOTE — Progress Notes (Signed)
Subjective:    Patient ID: Penny Horton, female    DOB: 1961/07/16, 62 y.o.   MRN: [id number]  HPI Virtual Visit via Video Note  I connected with the patient on 01/16/23 at 10:45 AM EDT by a video enabled telemedicine application and verified that I am speaking with the correct person using two identifiers.  Location patient: home Location provider:work or home office Persons participating in the virtual visit: patient, provider  I discussed the limitations of evaluation and management by telemedicine and the availability of in person appointments. The patient expressed understanding and agreed to proceed.   HPI: Here asking for some advice. She lives in [place], but she and her husband are here in [place] today. She asks to have some labs drawn. She has pulsatile tinnitus in the right ear along with some hearing loss, and she has been seeing Dr. Knox Saliva at [facility] for this. So far she has had a brain MRI and a CT angiogram of the head, both of which were negative. Dr. Luther Hearing has suggested she now have a catheter arteriogram. She is very hesitant to do this and she asks our opinion. She has read that things like a low B12 level can contribute to her problem.   ROS: See pertinent positives and negatives per HPI.  Past Medical History:  Diagnosis Date   Arthritis    osteoarthritis   Cataract    Degenerative disc disease, lumbar    Glaucoma    Neuromuscular disorder ([place]) 03/2011   lumbar area buldging disk/steroid injection    Past Surgical History:  Procedure Laterality Date   COLONOSCOPY  09/26/2019   per Dr. Steward Drone at [facility], no polyps, repeat in 5 years (mother had polyps)    D& [place] Bilateral 2007   inguinal hernias, using mesh    INGUINAL HERNIA REPAIR  2005   bilateral    Family History  Problem Relation Age of Onset   Colon polyps Mother    Arthritis Mother    Parkinson's disease Father     Breast cancer Neg Hx      Current Outpatient Medications:    fish oil-omega-3 fatty acids 1000 MG capsule, Take 1 g by mouth as needed.  , Disp: , Rfl:    LATANOPROST OP, latanoprost Take No date recorded No form recorded No frequency recorded No route recorded No set duration recorded No set duration amount recorded active No dosage strength recorded No dosage strength units of measure recorded, Disp: , Rfl:    Magnesium Gluconate 550 MG TABS, Take by mouth., Disp: , Rfl:   EXAM:  VITALS per patient if applicable:  GENERAL: alert, oriented, appears well and in no acute distress  HEENT: atraumatic, conjunttiva clear, no obvious abnormalities on inspection of external nose and ears  NECK: normal movements of the head and neck  LUNGS: on inspection no signs of respiratory distress, breathing rate appears normal, no obvious gross SOB, gasping or wheezing  CV: no obvious cyanosis  MS: moves all visible extremities without noticeable abnormality  PSYCH/NEURO: pleasant and cooperative, no obvious depression or anxiety, speech and thought processing grossly intact  ASSESSMENT AND PLAN: Pulsatile tinnitus. She will get labs drawn today including a B12 level, iron level, etc. As for the angiogram, I advised against her having this done because this is risky and I think it would be very unlikely to show anything. She agrees.  Alysia Penna, MD  Discussed the  following assessment and plan:  No diagnosis found.     I discussed the assessment and treatment plan with the patient. The patient was provided an opportunity to ask questions and all were answered. The patient agreed with the plan and demonstrated an understanding of the instructions.   The patient was advised to call back or seek an in-person evaluation if the symptoms worsen or if the condition fails to improve as anticipated.      Review of Systems     Objective:   Physical Exam        Assessment & Plan:

## 2023-01-17 ENCOUNTER — Encounter: Payer: Self-pay | Admitting: Family Medicine

## 2023-01-21 ENCOUNTER — Other Ambulatory Visit: Payer: Self-pay | Admitting: Family Medicine

## 2023-01-21 DIAGNOSIS — H93A1 Pulsatile tinnitus, right ear: Secondary | ICD-10-CM | POA: Diagnosis not present

## 2023-01-21 NOTE — Telephone Encounter (Signed)
Please fax my orders to the [facility] in [place]

## 2023-01-22 LAB — HGB A1C W/O EAG: Hgb A1c MFr Bld: 5.2 % (ref 4.8–5.6)

## 2023-01-22 LAB — BASIC METABOLIC PANEL
BUN/Creatinine Ratio: 14 (ref 12–28)
BUN: 13 mg/dL (ref 8–27)
CO2: 25 mmol/L (ref 20–29)
Calcium: 9.3 mg/dL (ref 8.7–10.3)
Chloride: 100 mmol/L (ref 96–106)
Creatinine, Ser: 0.92 mg/dL (ref 0.57–1.00)
Glucose: 79 mg/dL (ref 70–99)
Potassium: 4.3 mmol/L (ref 3.5–5.2)
Sodium: 138 mmol/L (ref 134–144)
eGFR: 71 mL/min/{1.73_m2} (ref >59)

## 2023-01-22 LAB — HEPATIC FUNCTION PANEL
ALT: 16 IU/L (ref 0–32)
AST: 26 IU/L (ref 0–40)
Albumin: 4.4 g/dL (ref 3.9–4.9)
Alkaline Phosphatase: 94 IU/L (ref 44–121)
Bilirubin Total: 0.3 mg/dL (ref 0.0–1.2)
Bilirubin, Direct: <0.1 mg/dL (ref 0.00–0.40)
Total Protein: 6.5 g/dL (ref 6.0–8.5)

## 2023-01-22 LAB — CBC/DIFF AMBIGUOUS DEFAULT
Basophils Absolute: 0 10*3/uL (ref 0.0–0.2)
Basos: 1 %
EOS (ABSOLUTE): 0.1 10*3/uL (ref 0.0–0.4)
Eos: 2 %
Hematocrit: 39.7 % (ref 34.0–46.6)
Hemoglobin: 13.4 g/dL (ref 11.1–15.9)
Immature Grans (Abs): 0 10*3/uL (ref 0.0–0.1)
Immature Granulocytes: 0 %
Lymphocytes Absolute: 0.9 10*3/uL (ref 0.7–3.1)
Lymphs: 18 %
MCH: 31.5 pg (ref 26.6–33.0)
MCHC: 33.8 g/dL (ref 31.5–35.7)
MCV: 93 fL (ref 79–97)
Monocytes Absolute: 0.4 10*3/uL (ref 0.1–0.9)
Monocytes: 8 %
Neutrophils Absolute: 3.7 10*3/uL (ref 1.4–7.0)
Neutrophils: 71 %
Platelets: 255 10*3/uL (ref 150–450)
RBC: 4.25 x10E6/uL (ref 3.77–5.28)
RDW: 12.1 % (ref 11.7–15.4)
WBC: 5.2 10*3/uL (ref 3.4–10.8)

## 2023-01-22 LAB — IRON AND TIBC
Iron Saturation: 35 % (ref 15–55)
Iron: 118 ug/dL (ref 27–139)
Total Iron Binding Capacity: 341 ug/dL (ref 250–450)
UIBC: 223 ug/dL (ref 118–369)

## 2023-01-22 LAB — VITAMIN B12: Vitamin B-12: 700 pg/mL (ref 232–1245)

## 2023-01-22 LAB — TSH: TSH: 1.56 u[IU]/mL (ref 0.450–4.500)

## 2023-01-22 LAB — MAGNESIUM: Magnesium: 2 mg/dL (ref 1.6–2.3)

## 2023-01-22 LAB — SPECIMEN STATUS REPORT

## 2023-01-23 ENCOUNTER — Other Ambulatory Visit: Payer: Self-pay

## 2023-04-03 DIAGNOSIS — H93A1 Pulsatile tinnitus, right ear: Secondary | ICD-10-CM | POA: Diagnosis not present

## 2023-04-03 DIAGNOSIS — Z1331 Encounter for screening for depression: Secondary | ICD-10-CM | POA: Diagnosis not present

## 2023-04-03 DIAGNOSIS — M25551 Pain in right hip: Secondary | ICD-10-CM | POA: Diagnosis not present

## 2023-06-13 ENCOUNTER — Encounter: Payer: BC Managed Care – PPO | Admitting: Family Medicine
